# Patient Record
Sex: Female | Born: 1980 | Race: Black or African American | Hispanic: No | Marital: Married | Smoking: Never smoker
Health system: Southern US, Community
[De-identification: ages and names within clinical notes are randomized; demographics above are authoritative.]

## PROBLEM LIST (undated history)

## (undated) DIAGNOSIS — F411 Generalized anxiety disorder: Secondary | ICD-10-CM

## (undated) DIAGNOSIS — I10 Essential (primary) hypertension: Secondary | ICD-10-CM

## (undated) DIAGNOSIS — F410 Panic disorder [episodic paroxysmal anxiety] without agoraphobia: Secondary | ICD-10-CM

## (undated) DIAGNOSIS — D509 Iron deficiency anemia, unspecified: Secondary | ICD-10-CM

## (undated) DIAGNOSIS — Z1159 Encounter for screening for other viral diseases: Secondary | ICD-10-CM

## (undated) DIAGNOSIS — R110 Nausea: Secondary | ICD-10-CM

## (undated) DIAGNOSIS — D649 Anemia, unspecified: Secondary | ICD-10-CM

## (undated) DIAGNOSIS — R0609 Other forms of dyspnea: Secondary | ICD-10-CM

## (undated) DIAGNOSIS — Z114 Encounter for screening for human immunodeficiency virus [HIV]: Secondary | ICD-10-CM

## (undated) DIAGNOSIS — Z8611 Personal history of tuberculosis: Secondary | ICD-10-CM

## (undated) DIAGNOSIS — IMO0002 Reserved for concepts with insufficient information to code with codable children: Secondary | ICD-10-CM

## (undated) DIAGNOSIS — A159 Respiratory tuberculosis unspecified: Secondary | ICD-10-CM

## (undated) DIAGNOSIS — O24419 Gestational diabetes mellitus in pregnancy, unspecified control: Secondary | ICD-10-CM

## (undated) DIAGNOSIS — R87619 Unspecified abnormal cytological findings in specimens from cervix uteri: Secondary | ICD-10-CM

## (undated) HISTORY — PX: EYE SURGERY: SHX253

## (undated) HISTORY — PX: NECK LESION BIOPSY: SHX2078

## (undated) HISTORY — DX: Reserved for concepts with insufficient information to code with codable children: IMO0002

## (undated) HISTORY — DX: Unspecified abnormal cytological findings in specimens from cervix uteri: R87.619

## (undated) HISTORY — DX: Gestational diabetes mellitus in pregnancy, unspecified control: O24.419

## (undated) HISTORY — DX: Personal history of tuberculosis: Z86.11

---
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
## 1999-05-19 ENCOUNTER — Emergency Department (HOSPITAL_COMMUNITY): Admission: EM | Admit: 1999-05-19 | Discharge: 1999-05-19 | Payer: Self-pay | Admitting: Emergency Medicine

## 1999-05-21 ENCOUNTER — Emergency Department (HOSPITAL_COMMUNITY): Admission: EM | Admit: 1999-05-21 | Discharge: 1999-05-21 | Payer: Self-pay | Admitting: Emergency Medicine

## 1999-08-04 ENCOUNTER — Inpatient Hospital Stay (HOSPITAL_COMMUNITY): Admission: EM | Admit: 1999-08-04 | Discharge: 1999-08-04 | Payer: Self-pay | Admitting: *Deleted

## 2000-04-12 ENCOUNTER — Emergency Department (HOSPITAL_COMMUNITY): Admission: EM | Admit: 2000-04-12 | Discharge: 2000-04-12 | Payer: Self-pay | Admitting: Emergency Medicine

## 2000-09-21 ENCOUNTER — Emergency Department (HOSPITAL_COMMUNITY): Admission: EM | Admit: 2000-09-21 | Discharge: 2000-09-21 | Payer: Self-pay | Admitting: Emergency Medicine

## 2002-04-23 ENCOUNTER — Emergency Department (HOSPITAL_COMMUNITY): Admission: EM | Admit: 2002-04-23 | Discharge: 2002-04-23 | Payer: Self-pay | Admitting: Emergency Medicine

## 2004-04-11 ENCOUNTER — Emergency Department (HOSPITAL_COMMUNITY): Admission: EM | Admit: 2004-04-11 | Discharge: 2004-04-11 | Payer: Self-pay | Admitting: Family Medicine

## 2005-09-04 ENCOUNTER — Emergency Department (HOSPITAL_COMMUNITY): Admission: EM | Admit: 2005-09-04 | Discharge: 2005-09-04 | Payer: Self-pay | Admitting: Family Medicine

## 2008-07-08 ENCOUNTER — Emergency Department (HOSPITAL_COMMUNITY): Admission: EM | Admit: 2008-07-08 | Discharge: 2008-07-08 | Payer: Self-pay | Admitting: Emergency Medicine

## 2009-05-19 ENCOUNTER — Emergency Department (HOSPITAL_COMMUNITY): Admission: EM | Admit: 2009-05-19 | Discharge: 2009-05-19 | Payer: Self-pay | Admitting: Emergency Medicine

## 2010-08-02 LAB — WET PREP, GENITAL
Clue Cells Wet Prep HPF POC: NONE SEEN
Trich, Wet Prep: NONE SEEN

## 2010-08-02 LAB — URINALYSIS, ROUTINE W REFLEX MICROSCOPIC
Bilirubin Urine: NEGATIVE
Glucose, UA: NEGATIVE mg/dL
Hgb urine dipstick: NEGATIVE
Ketones, ur: 15 mg/dL — AB
Nitrite: NEGATIVE
Protein, ur: NEGATIVE mg/dL
Specific Gravity, Urine: 1.03 (ref 1.005–1.030)
Urobilinogen, UA: 1 mg/dL (ref 0.0–1.0)
pH: 6 (ref 5.0–8.0)

## 2010-08-02 LAB — POCT PREGNANCY, URINE: Preg Test, Ur: NEGATIVE

## 2010-12-10 LAB — ABO/RH: RH Type: POSITIVE

## 2010-12-10 LAB — STREP B DNA PROBE: GBS: POSITIVE

## 2010-12-10 LAB — GC/CHLAMYDIA PROBE AMP, GENITAL: Chlamydia: NEGATIVE

## 2010-12-10 LAB — RUBELLA ANTIBODY, IGM: Rubella: IMMUNE

## 2010-12-19 ENCOUNTER — Emergency Department: Payer: Self-pay | Admitting: Otolaryngology

## 2011-04-30 ENCOUNTER — Ambulatory Visit: Payer: Self-pay

## 2011-06-24 ENCOUNTER — Encounter: Payer: Self-pay | Admitting: Obstetrics and Gynecology

## 2011-06-24 LAB — URINALYSIS, COMPLETE
Bacteria: NONE SEEN
Bilirubin,UR: NEGATIVE
Blood: NEGATIVE
Glucose,UR: NEGATIVE mg/dL (ref 0–75)
Leukocyte Esterase: NEGATIVE
RBC,UR: 3 /HPF (ref 0–5)
Specific Gravity: 1.026 (ref 1.003–1.030)
Squamous Epithelial: 16
WBC UR: 3 /HPF (ref 0–5)

## 2011-07-08 ENCOUNTER — Other Ambulatory Visit: Payer: Self-pay | Admitting: Obstetrics

## 2011-07-08 ENCOUNTER — Encounter: Payer: Self-pay | Admitting: Obstetrics and Gynecology

## 2011-07-08 LAB — URINALYSIS, COMPLETE
Bilirubin,UR: NEGATIVE
Glucose,UR: NEGATIVE mg/dL (ref 0–75)
Nitrite: NEGATIVE
Ph: 6 (ref 4.5–8.0)
Specific Gravity: 1.028 (ref 1.003–1.030)
WBC UR: 7 /HPF (ref 0–5)

## 2011-07-09 ENCOUNTER — Telehealth (HOSPITAL_COMMUNITY): Payer: Self-pay | Admitting: *Deleted

## 2011-07-09 ENCOUNTER — Encounter (HOSPITAL_COMMUNITY): Payer: Self-pay | Admitting: *Deleted

## 2011-07-09 NOTE — Telephone Encounter (Signed)
Preadmission screen  

## 2011-07-13 ENCOUNTER — Inpatient Hospital Stay (HOSPITAL_COMMUNITY)
Admission: RE | Admit: 2011-07-13 | Discharge: 2011-07-17 | DRG: 766 | Disposition: A | Discharge status: Home / Self Care | Source: Ambulatory Visit | Attending: Obstetrics | Admitting: Obstetrics

## 2011-07-13 ENCOUNTER — Encounter (HOSPITAL_COMMUNITY): Payer: Self-pay | Admitting: Anesthesiology

## 2011-07-13 ENCOUNTER — Encounter (HOSPITAL_COMMUNITY): Payer: Self-pay

## 2011-07-13 DIAGNOSIS — E119 Type 2 diabetes mellitus without complications: Secondary | ICD-10-CM | POA: Diagnosis present

## 2011-07-13 DIAGNOSIS — O9903 Anemia complicating the puerperium: Secondary | ICD-10-CM | POA: Diagnosis not present

## 2011-07-13 DIAGNOSIS — O324XX0 Maternal care for high head at term, not applicable or unspecified: Secondary | ICD-10-CM | POA: Diagnosis present

## 2011-07-13 DIAGNOSIS — O99814 Abnormal glucose complicating childbirth: Principal | ICD-10-CM | POA: Diagnosis present

## 2011-07-13 DIAGNOSIS — D649 Anemia, unspecified: Secondary | ICD-10-CM | POA: Diagnosis not present

## 2011-07-13 DIAGNOSIS — D62 Acute posthemorrhagic anemia: Secondary | ICD-10-CM | POA: Diagnosis not present

## 2011-07-13 HISTORY — DX: Respiratory tuberculosis unspecified: A15.9

## 2011-07-13 LAB — RPR: RPR Ser Ql: NONREACTIVE

## 2011-07-13 LAB — CBC
MCH: 26.1 pg (ref 26.0–34.0)
Platelets: 348 10*3/uL (ref 150–400)
RBC: 3.95 MIL/uL (ref 3.87–5.11)

## 2011-07-13 MED ORDER — OXYTOCIN 20 UNITS IN LACTATED RINGERS INFUSION - SIMPLE
1.0000 m[IU]/min | INTRAVENOUS | Status: DC
  Administered 2011-07-13: 1 m[IU]/min via INTRAVENOUS
  Filled 2011-07-13: qty 1000

## 2011-07-13 MED ORDER — TERBUTALINE SULFATE 1 MG/ML IJ SOLN: 0.2500 mg | Freq: Once | INTRAMUSCULAR | Status: AC | PRN

## 2011-07-13 MED ORDER — EPHEDRINE 5 MG/ML INJ: 10.0000 mg | INTRAVENOUS | Status: DC | PRN

## 2011-07-13 MED ORDER — LIDOCAINE HCL (PF) 1 % IJ SOLN
30.0000 mL | INTRAMUSCULAR | Status: DC | PRN
  Filled 2011-07-13 (×2): qty 30

## 2011-07-13 MED ORDER — FLEET ENEMA 7-19 GM/118ML RE ENEM: 1.0000 | ENEMA | RECTAL | Status: DC | PRN

## 2011-07-13 MED ORDER — IBUPROFEN 600 MG PO TABS: 600.0000 mg | ORAL_TABLET | Freq: Four times a day (QID) | ORAL | Status: DC | PRN

## 2011-07-13 MED ORDER — ONDANSETRON HCL 4 MG/2ML IJ SOLN: 4.0000 mg | Freq: Four times a day (QID) | INTRAMUSCULAR | Status: DC | PRN

## 2011-07-13 MED ORDER — LACTATED RINGERS IV SOLN: 500.0000 mL | INTRAVENOUS | Status: DC | PRN

## 2011-07-13 MED ORDER — PHENYLEPHRINE 40 MCG/ML (10ML) SYRINGE FOR IV PUSH (FOR BLOOD PRESSURE SUPPORT): 80.0000 ug | PREFILLED_SYRINGE | INTRAVENOUS | Status: DC | PRN

## 2011-07-13 MED ORDER — OXYTOCIN BOLUS FROM INFUSION
500.0000 mL | Freq: Once | INTRAVENOUS | Status: DC
  Filled 2011-07-13: qty 500

## 2011-07-13 MED ORDER — BUTORPHANOL TARTRATE 2 MG/ML IJ SOLN: 1.0000 mg | INTRAMUSCULAR | Status: DC | PRN

## 2011-07-13 MED ORDER — ZOLPIDEM TARTRATE 10 MG PO TABS: 10.0000 mg | ORAL_TABLET | Freq: Every evening | ORAL | Status: DC | PRN

## 2011-07-13 MED ORDER — LACTATED RINGERS IV SOLN
INTRAVENOUS | Status: DC
  Administered 2011-07-13 (×2): via INTRAVENOUS
  Administered 2011-07-14: 300 mL via INTRAVENOUS
  Administered 2011-07-14 (×2): via INTRAVENOUS

## 2011-07-13 MED ORDER — FENTANYL 2.5 MCG/ML BUPIVACAINE 1/10 % EPIDURAL INFUSION (WH - ANES)
INTRAMUSCULAR | Status: DC | PRN
  Administered 2011-07-13: 14 mL/h via EPIDURAL

## 2011-07-13 MED ORDER — HYDROXYZINE HCL 50 MG PO TABS
50.0000 mg | ORAL_TABLET | Freq: Four times a day (QID) | ORAL | Status: DC | PRN
  Filled 2011-07-13: qty 1

## 2011-07-13 MED ORDER — OXYCODONE-ACETAMINOPHEN 5-325 MG PO TABS: 1.0000 | ORAL_TABLET | ORAL | Status: DC | PRN

## 2011-07-13 MED ORDER — DIPHENHYDRAMINE HCL 50 MG/ML IJ SOLN: 12.5000 mg | INTRAMUSCULAR | Status: DC | PRN

## 2011-07-13 MED ORDER — LIDOCAINE HCL 1.5 % IJ SOLN
INTRAMUSCULAR | Status: DC | PRN
  Administered 2011-07-13 (×3): 5 mL via EPIDURAL

## 2011-07-13 MED ORDER — EPHEDRINE 5 MG/ML INJ
10.0000 mg | INTRAVENOUS | Status: DC | PRN
  Filled 2011-07-13: qty 4

## 2011-07-13 MED ORDER — HYDROXYZINE HCL 50 MG/ML IM SOLN
50.0000 mg | Freq: Four times a day (QID) | INTRAMUSCULAR | Status: DC | PRN
  Filled 2011-07-13: qty 1

## 2011-07-13 MED ORDER — FENTANYL 2.5 MCG/ML BUPIVACAINE 1/10 % EPIDURAL INFUSION (WH - ANES)
14.0000 mL/h | INTRAMUSCULAR | Status: DC
  Administered 2011-07-14 (×4): 14 mL/h via EPIDURAL
  Filled 2011-07-13 (×6): qty 60

## 2011-07-13 MED ORDER — PENICILLIN G POTASSIUM 5000000 UNITS IJ SOLR
2.5000 10*6.[IU] | INTRAVENOUS | Status: DC
  Administered 2011-07-13 – 2011-07-14 (×4): 2.5 10*6.[IU] via INTRAVENOUS
  Filled 2011-07-13 (×8): qty 2.5

## 2011-07-13 MED ORDER — LACTATED RINGERS IV SOLN: 500.0000 mL | Freq: Once | INTRAVENOUS | Status: DC

## 2011-07-13 MED ORDER — OXYTOCIN 20 UNITS IN LACTATED RINGERS INFUSION - SIMPLE: 125.0000 mL/h | Freq: Once | INTRAVENOUS | Status: DC

## 2011-07-13 MED ORDER — PHENYLEPHRINE 40 MCG/ML (10ML) SYRINGE FOR IV PUSH (FOR BLOOD PRESSURE SUPPORT)
80.0000 ug | PREFILLED_SYRINGE | INTRAVENOUS | Status: DC | PRN
  Filled 2011-07-13: qty 5

## 2011-07-13 MED ORDER — CITRIC ACID-SODIUM CITRATE 334-500 MG/5ML PO SOLN
30.0000 mL | ORAL | Status: DC | PRN
  Administered 2011-07-14: 30 mL via ORAL
  Filled 2011-07-13: qty 15

## 2011-07-13 MED ORDER — ACETAMINOPHEN 325 MG PO TABS: 650.0000 mg | ORAL_TABLET | ORAL | Status: DC | PRN

## 2011-07-13 MED ORDER — PENICILLIN G POTASSIUM 5000000 UNITS IJ SOLR
5.0000 10*6.[IU] | Freq: Once | INTRAVENOUS | Status: AC
  Administered 2011-07-13: 5 10*6.[IU] via INTRAVENOUS
  Filled 2011-07-13: qty 5

## 2011-07-13 MED ORDER — MISOPROSTOL 25 MCG QUARTER TABLET
25.0000 ug | ORAL_TABLET | ORAL | Status: DC | PRN
  Administered 2011-07-13 (×2): 25 ug via VAGINAL
  Filled 2011-07-13 (×2): qty 0.25

## 2011-07-13 NOTE — Progress Notes (Signed)
Ashley Nielsen is a 31 y.o. G1P0000 at [redacted]w[redacted]d by LMP admitted for induction of labor due to Gestational diabetes and poor compliance and poor control.  Subjective:   Objective: BP 142/89  Pulse 88  Temp(Src) 98.5 F (36.9 C) (Oral)  Resp 20  Ht 5\' 10"  (1.778 m)  Wt 296 lb (134.265 kg)  BMI 42.47 kg/m2  LMP 10/13/2010      FHT:  FHR: 150 bpm, variability: moderate,  accelerations:  Present,  decelerations:  Absent UC:   none SVE:   Dilation: Fingertip Effacement (%): 50 Station: -3 Exam by:: Erline Hau RNC  Labs: Lab Results  Component Value Date   WBC 10.2 07/13/2011   HGB 10.3* 07/13/2011   HCT 31.4* 07/13/2011   MCV 79.5 07/13/2011   PLT 348 07/13/2011    Assessment / Plan: 39 weeks.  Gestational diabetes.  Poor compliance and poor control.  2-stage IOL.  Labor: Cytotec cervical ripening started. Preeclampsia:  n/a Fetal Wellbeing:  Category I Pain Control:  not needed I/D:  n/a Anticipated MOD:  NSVD  Ayinde Swim A 07/13/2011, 12:30 PM

## 2011-07-13 NOTE — H&P (Signed)
Ashley Nielsen is a 31 y.o. female presenting for IOL for poor compliance and poorly controlled gestational diabetes. Maternal Medical History:  Fetal activity: Perceived fetal activity is normal.   Last perceived fetal movement was within the past hour.    Prenatal complications: no prenatal complications Prenatal Complications - Diabetes: gestational.    OB History    Grav Para Term Preterm Abortions TAB SAB Ect Mult Living   1              Past Medical History  Diagnosis Date  . Abnormal Pap smear   . History of tuberculosis 2000    treated  . Gestational diabetes     diet controlled  . Tuberculosis    Past Surgical History  Procedure Date  . Neck lesion biopsy     had tb in lymph node  . Eye surgery     abcess behind L eye   Family History: family history includes Alcohol abuse in her maternal grandmother; Alzheimer's disease in her maternal grandmother; Depression in her mother; Diabetes in her mother; Heart disease in her maternal grandmother; Hyperlipidemia in her mother; Hypertension in her mother; Learning disabilities in her other; and Stroke in her maternal grandmother. Social History:  reports that she has never smoked. She has never used smokeless tobacco. She reports that she does not drink alcohol or use illicit drugs.  Review of Systems  All other systems reviewed and are negative.      Height 5\' 10"  (1.778 m), weight 296 lb (134.265 kg), last menstrual period 10/13/2010. Maternal Exam:  Abdomen: Patient reports no abdominal tenderness. Fetal presentation: vertex  Introitus: Normal vulva. Normal vagina.    Physical Exam  Nursing note and vitals reviewed. Constitutional: She appears well-developed and well-nourished.  Cardiovascular: Normal rate and regular rhythm.   Respiratory: Effort normal and breath sounds normal.  GI: Soft.  Genitourinary: Vagina normal and uterus normal.  Musculoskeletal: Normal range of motion.  Skin: Skin is warm and dry.    Prenatal labs: ABO, Rh: AB/Positive/-- (07/26 0000) Antibody: Negative (07/26 0000) Rubella: Immune (07/26 0000) RPR: Nonreactive (07/24 0810)  HBsAg: Negative (12/10 0000)  HIV: Non-reactive (07/26 0000)  GBS: Positive (07/26 0000)   Assessment/Plan: 39 weeks.  Gestational diabetes.  Poor compliance and poor control.  2 stage IOL.   Fayola Meckes A 07/13/2011, 9:18 AM

## 2011-07-13 NOTE — Anesthesia Procedure Notes (Signed)
Epidural Patient location during procedure: OB Start time: 07/13/2011 8:39 PM End time: 07/13/2011 8:43 PM Reason for block: procedure for pain  Staffing Anesthesiologist: Sandrea Hughs Performed by: anesthesiologist   Preanesthetic Checklist Completed: patient identified, site marked, surgical consent, pre-op evaluation, timeout performed, IV checked, risks and benefits discussed and monitors and equipment checked  Epidural Patient position: sitting Prep: site prepped and draped and DuraPrep Patient monitoring: continuous pulse ox and blood pressure Approach: midline Injection technique: LOR air  Needle:  Needle type: Tuohy  Needle gauge: 17 G Needle length: 9 cm Needle insertion depth: 9 cm Catheter type: closed end flexible Catheter size: 19 Gauge Catheter at skin depth: 14 cm Test dose: negative and 1.5% lidocaine  Assessment Sensory level: T10 Events: blood not aspirated, injection not painful, no injection resistance, positive IV test and no paresthesia  Additional Notes So catheter d/c tip intact and procedure redone @L2 -L3 s comp. - asp - test dose

## 2011-07-13 NOTE — Anesthesia Preprocedure Evaluation (Signed)
Anesthesia Evaluation  Patient identified by MRN, date of birth, ID band Patient awake    Reviewed: Allergy & Precautions, H&P , NPO status , Patient's Chart, lab work & pertinent test results  Airway Mallampati: II TM Distance: >3 FB Neck ROM: full    Dental No notable dental hx.    Pulmonary neg pulmonary ROS,    Pulmonary exam normal       Cardiovascular neg cardio ROS     Neuro/Psych Negative Neurological ROS  Negative Psych ROS   GI/Hepatic negative GI ROS, Neg liver ROS,   Endo/Other  Diabetes mellitus-, GestationalMorbid obesity  Renal/GU negative Renal ROS  Genitourinary negative   Musculoskeletal   Abdominal (+) obese,   Peds negative pediatric ROS (+)  Hematology negative hematology ROS (+)   Anesthesia Other Findings   Reproductive/Obstetrics (+) Pregnancy                           Anesthesia Physical Anesthesia Plan  ASA: III  Anesthesia Plan: Epidural   Post-op Pain Management:    Induction:   Airway Management Planned:   Additional Equipment:   Intra-op Plan:   Post-operative Plan:   Informed Consent: I have reviewed the patients History and Physical, chart, labs and discussed the procedure including the risks, benefits and alternatives for the proposed anesthesia with the patient or authorized representative who has indicated his/her understanding and acceptance.     Plan Discussed with:   Anesthesia Plan Comments:         Anesthesia Quick Evaluation

## 2011-07-13 NOTE — Progress Notes (Signed)
Alletta Mattos is a 31 y.o. G1P0000 at [redacted]w[redacted]d by LMP admitted for induction of labor due to GDM, poor compliance and control. Due date 07-20-11.  Subjective:   Objective: BP 149/69  Pulse 81  Temp(Src) 98.5 F (36.9 C) (Oral)  Resp 18  Ht 5\' 10"  (1.778 m)  Wt 296 lb (134.265 kg)  BMI 42.47 kg/m2  LMP 10/13/2010      FHT:  FHR: 150 bpm, variability: moderate,  accelerations:  Present,  decelerations:  Absent UC:   regular, every 4-7 minutes SVE:   Dilation: 1 Effacement (%): 50 Station: -3 Exam by:: Erline Hau RNC  Labs: Lab Results  Component Value Date   WBC 10.2 07/13/2011   HGB 10.3* 07/13/2011   HCT 31.4* 07/13/2011   MCV 79.5 07/13/2011   PLT 348 07/13/2011    Assessment / Plan: 39 weeks.  IOL.  SROM.  Pitocin started.  Labor: Latent phase. Preeclampsia:  n/a Fetal Wellbeing:  Category I Pain Control:  Labor support without medications I/D:  n/a Anticipated MOD:  NSVD  Rosaria Kubin A 07/13/2011, 5:52 PM

## 2011-07-13 NOTE — Progress Notes (Signed)
Ashley Nielsen is Nielsen 31 y.o. G1P0000 at [redacted]w[redacted]d by LMP admitted for induction of labor due to Gestational diabetes.  Subjective:   Objective: BP 92/60  Pulse 90  Temp(Src) 98.8 F (37.1 C) (Oral)  Resp 18  Ht 5\' 10"  (1.778 m)  Wt 296 lb (134.265 kg)  BMI 42.47 kg/m2  SpO2 100%  LMP 10/13/2010      FHT:  FHR: 150 bpm, variability: moderate,  accelerations:  Present,  decelerations:  Absent UC:   regular, every 3-5 minutes SVE:   Dilation: 2 Effacement (%): 50 Station: -2 Exam by:: Nielsen.Davis,RN  Labs: Lab Results  Component Value Date   WBC 10.2 07/13/2011   HGB 10.3* 07/13/2011   HCT 31.4* 07/13/2011   MCV 79.5 07/13/2011   PLT 348 07/13/2011    Assessment / Plan: 39 weeks.  GDM.  IOL.  Latent phase.  SROM.  Continue pitocin.  Labor: Latent phase Preeclampsia:  n/Nielsen Fetal Wellbeing:  Category I Pain Control:  Labor support without medications I/D:  n/Nielsen Anticipated MOD:  NSVD  Ashley Nielsen 07/13/2011, 10:50 PM

## 2011-07-14 ENCOUNTER — Encounter (HOSPITAL_COMMUNITY): Payer: Self-pay

## 2011-07-14 ENCOUNTER — Encounter (HOSPITAL_COMMUNITY)
Admission: RE | Disposition: A | Discharge status: Home / Self Care | Payer: Self-pay | Source: Ambulatory Visit | Attending: Obstetrics

## 2011-07-14 ENCOUNTER — Encounter (HOSPITAL_COMMUNITY): Payer: Self-pay | Admitting: Anesthesiology

## 2011-07-14 ENCOUNTER — Inpatient Hospital Stay (HOSPITAL_COMMUNITY): Admission: RE | Admit: 2011-07-14 | Payer: Self-pay | Source: Ambulatory Visit

## 2011-07-14 ENCOUNTER — Inpatient Hospital Stay (HOSPITAL_COMMUNITY): Admitting: Anesthesiology

## 2011-07-14 DIAGNOSIS — E119 Type 2 diabetes mellitus without complications: Secondary | ICD-10-CM | POA: Diagnosis present

## 2011-07-14 LAB — HEMOGLOBIN AND HEMATOCRIT, BLOOD
HCT: 29.1 % — ABNORMAL LOW (ref 36.0–46.0)
Hemoglobin: 9.3 g/dL — ABNORMAL LOW (ref 12.0–15.0)

## 2011-07-14 LAB — CBC
HCT: 29.3 % — ABNORMAL LOW (ref 36.0–46.0)
Hemoglobin: 9.4 g/dL — ABNORMAL LOW (ref 12.0–15.0)
MCH: 25.8 pg — ABNORMAL LOW (ref 26.0–34.0)
MCHC: 32.1 g/dL (ref 30.0–36.0)
MCV: 80.5 fL (ref 78.0–100.0)
RDW: 16.4 % — ABNORMAL HIGH (ref 11.5–15.5)

## 2011-07-14 SURGERY — Surgical Case
Anesthesia: Regional | Site: Abdomen | Wound class: Clean Contaminated

## 2011-07-14 MED ORDER — SIMETHICONE 80 MG PO CHEW
80.0000 mg | CHEWABLE_TABLET | ORAL | Status: DC | PRN
  Administered 2011-07-14 – 2011-07-17 (×9): 80 mg via ORAL

## 2011-07-14 MED ORDER — ONDANSETRON HCL 4 MG/2ML IJ SOLN
INTRAMUSCULAR | Status: DC | PRN
  Administered 2011-07-14: 4 mg via INTRAVENOUS

## 2011-07-14 MED ORDER — PRENATAL MULTIVITAMIN CH
1.0000 | ORAL_TABLET | Freq: Every day | ORAL | Status: DC
  Administered 2011-07-15 – 2011-07-17 (×3): 1 via ORAL
  Filled 2011-07-14 (×3): qty 1

## 2011-07-14 MED ORDER — CEFAZOLIN SODIUM-DEXTROSE 2-3 GM-% IV SOLR
2.0000 g | INTRAVENOUS | Status: DC
  Filled 2011-07-14: qty 50

## 2011-07-14 MED ORDER — WITCH HAZEL-GLYCERIN EX PADS: 1.0000 "application " | MEDICATED_PAD | CUTANEOUS | Status: DC | PRN

## 2011-07-14 MED ORDER — FENTANYL CITRATE 0.05 MG/ML IJ SOLN
INTRAMUSCULAR | Status: AC
  Filled 2011-07-14: qty 2

## 2011-07-14 MED ORDER — MEASLES, MUMPS & RUBELLA VAC ~~LOC~~ INJ: 0.5000 mL | INJECTION | Freq: Once | SUBCUTANEOUS | Status: DC

## 2011-07-14 MED ORDER — PHENYLEPHRINE 40 MCG/ML (10ML) SYRINGE FOR IV PUSH (FOR BLOOD PRESSURE SUPPORT)
PREFILLED_SYRINGE | INTRAVENOUS | Status: AC
  Filled 2011-07-14: qty 5

## 2011-07-14 MED ORDER — DIPHENHYDRAMINE HCL 25 MG PO CAPS
25.0000 mg | ORAL_CAPSULE | ORAL | Status: DC | PRN
  Administered 2011-07-15 – 2011-07-16 (×4): 25 mg via ORAL
  Filled 2011-07-14 (×3): qty 1

## 2011-07-14 MED ORDER — MEPERIDINE HCL 25 MG/ML IJ SOLN: 6.2500 mg | INTRAMUSCULAR | Status: DC | PRN

## 2011-07-14 MED ORDER — LACTATED RINGERS IV SOLN
INTRAVENOUS | Status: DC
  Administered 2011-07-14 – 2011-07-15 (×2): via INTRAVENOUS

## 2011-07-14 MED ORDER — BUPIVACAINE HCL (PF) 0.25 % IJ SOLN
INTRAMUSCULAR | Status: DC | PRN
  Administered 2011-07-14: 5 mL via EPIDURAL

## 2011-07-14 MED ORDER — OXYTOCIN 10 UNIT/ML IJ SOLN
INTRAMUSCULAR | Status: DC | PRN
  Administered 2011-07-14: 40 [IU] via INTRAMUSCULAR
  Administered 2011-07-14: 20 [IU] via INTRAMUSCULAR

## 2011-07-14 MED ORDER — PHENYLEPHRINE 40 MCG/ML (10ML) SYRINGE FOR IV PUSH (FOR BLOOD PRESSURE SUPPORT)
PREFILLED_SYRINGE | INTRAVENOUS | Status: AC
  Filled 2011-07-14: qty 20

## 2011-07-14 MED ORDER — ONDANSETRON HCL 4 MG/2ML IJ SOLN: 4.0000 mg | Freq: Three times a day (TID) | INTRAMUSCULAR | Status: DC | PRN

## 2011-07-14 MED ORDER — SODIUM CHLORIDE 0.9 % IV SOLN: 1.0000 ug/kg/h | INTRAVENOUS | Status: DC | PRN

## 2011-07-14 MED ORDER — OXYTOCIN 10 UNIT/ML IJ SOLN
INTRAMUSCULAR | Status: AC
  Filled 2011-07-14: qty 4

## 2011-07-14 MED ORDER — MAGNESIUM HYDROXIDE 400 MG/5ML PO SUSP: 30.0000 mL | ORAL | Status: DC | PRN

## 2011-07-14 MED ORDER — NALBUPHINE HCL 10 MG/ML IJ SOLN: 5.0000 mg | INTRAMUSCULAR | Status: DC | PRN

## 2011-07-14 MED ORDER — EPHEDRINE 5 MG/ML INJ
INTRAVENOUS | Status: AC
  Filled 2011-07-14: qty 10

## 2011-07-14 MED ORDER — FENTANYL CITRATE 0.05 MG/ML IJ SOLN: 25.0000 ug | INTRAMUSCULAR | Status: DC | PRN

## 2011-07-14 MED ORDER — IBUPROFEN 600 MG PO TABS
600.0000 mg | ORAL_TABLET | Freq: Four times a day (QID) | ORAL | Status: DC
  Administered 2011-07-15 – 2011-07-17 (×10): 600 mg via ORAL
  Filled 2011-07-14 (×8): qty 1

## 2011-07-14 MED ORDER — NALOXONE HCL 0.4 MG/ML IJ SOLN: 0.4000 mg | INTRAMUSCULAR | Status: DC | PRN

## 2011-07-14 MED ORDER — MORPHINE SULFATE 0.5 MG/ML IJ SOLN
INTRAMUSCULAR | Status: AC
  Filled 2011-07-14: qty 10

## 2011-07-14 MED ORDER — ONDANSETRON HCL 4 MG PO TABS: 4.0000 mg | ORAL_TABLET | ORAL | Status: DC | PRN

## 2011-07-14 MED ORDER — LACTATED RINGERS IV SOLN
INTRAVENOUS | Status: DC | PRN
  Administered 2011-07-14: 15:00:00 via INTRAVENOUS

## 2011-07-14 MED ORDER — DIBUCAINE 1 % RE OINT: 1.0000 "application " | TOPICAL_OINTMENT | RECTAL | Status: DC | PRN

## 2011-07-14 MED ORDER — KETOROLAC TROMETHAMINE 60 MG/2ML IM SOLN
60.0000 mg | Freq: Once | INTRAMUSCULAR | Status: AC | PRN
  Administered 2011-07-14: 60 mg via INTRAMUSCULAR

## 2011-07-14 MED ORDER — KETOROLAC TROMETHAMINE 30 MG/ML IJ SOLN: 30.0000 mg | Freq: Four times a day (QID) | INTRAMUSCULAR | Status: AC | PRN

## 2011-07-14 MED ORDER — ONDANSETRON HCL 4 MG/2ML IJ SOLN: 4.0000 mg | INTRAMUSCULAR | Status: DC | PRN

## 2011-07-14 MED ORDER — METOCLOPRAMIDE HCL 5 MG/ML IJ SOLN: 10.0000 mg | Freq: Three times a day (TID) | INTRAMUSCULAR | Status: DC | PRN

## 2011-07-14 MED ORDER — DIPHENHYDRAMINE HCL 50 MG/ML IJ SOLN: 12.5000 mg | INTRAMUSCULAR | Status: DC | PRN

## 2011-07-14 MED ORDER — SODIUM CHLORIDE 0.9 % IJ SOLN: 3.0000 mL | INTRAMUSCULAR | Status: DC | PRN

## 2011-07-14 MED ORDER — OXYCODONE-ACETAMINOPHEN 5-325 MG PO TABS
1.0000 | ORAL_TABLET | ORAL | Status: DC | PRN
  Administered 2011-07-15 (×2): 1 via ORAL
  Administered 2011-07-16 (×2): 2 via ORAL
  Administered 2011-07-16: 1 via ORAL
  Administered 2011-07-16 – 2011-07-17 (×3): 2 via ORAL
  Filled 2011-07-14 (×2): qty 1
  Filled 2011-07-14 (×5): qty 2
  Filled 2011-07-14: qty 1

## 2011-07-14 MED ORDER — CEFAZOLIN SODIUM 1-5 GM-% IV SOLN
INTRAVENOUS | Status: DC | PRN
  Administered 2011-07-14: 2 g via INTRAVENOUS

## 2011-07-14 MED ORDER — OXYTOCIN 20 UNITS IN LACTATED RINGERS INFUSION - SIMPLE: 125.0000 mL/h | INTRAVENOUS | Status: AC

## 2011-07-14 MED ORDER — SENNOSIDES-DOCUSATE SODIUM 8.6-50 MG PO TABS
2.0000 | ORAL_TABLET | Freq: Every day | ORAL | Status: DC
  Administered 2011-07-14 – 2011-07-16 (×3): 2 via ORAL

## 2011-07-14 MED ORDER — SODIUM BICARBONATE 8.4 % IV SOLN
INTRAVENOUS | Status: DC | PRN
  Administered 2011-07-14: 5 mL via EPIDURAL

## 2011-07-14 MED ORDER — FENTANYL CITRATE 0.05 MG/ML IJ SOLN
100.0000 ug | Freq: Once | INTRAMUSCULAR | Status: AC
  Administered 2011-07-14: 100 ug via EPIDURAL

## 2011-07-14 MED ORDER — EPHEDRINE SULFATE 50 MG/ML IJ SOLN
INTRAMUSCULAR | Status: DC | PRN
  Administered 2011-07-14: 20 mg via INTRAVENOUS
  Administered 2011-07-14: 10 mg via INTRAVENOUS

## 2011-07-14 MED ORDER — SCOPOLAMINE 1 MG/3DAYS TD PT72: 1.0000 | MEDICATED_PATCH | Freq: Once | TRANSDERMAL | Status: DC

## 2011-07-14 MED ORDER — LANOLIN HYDROUS EX OINT: 1.0000 "application " | TOPICAL_OINTMENT | CUTANEOUS | Status: DC | PRN

## 2011-07-14 MED ORDER — FERROUS SULFATE 325 (65 FE) MG PO TABS
325.0000 mg | ORAL_TABLET | Freq: Two times a day (BID) | ORAL | Status: DC
  Administered 2011-07-15 – 2011-07-17 (×5): 325 mg via ORAL
  Filled 2011-07-14 (×5): qty 1

## 2011-07-14 MED ORDER — PHENYLEPHRINE HCL 10 MG/ML IJ SOLN
INTRAMUSCULAR | Status: DC | PRN
  Administered 2011-07-14 (×3): 160 ug via INTRAVENOUS
  Administered 2011-07-14 (×3): 40 ug via INTRAVENOUS

## 2011-07-14 MED ORDER — DIPHENHYDRAMINE HCL 25 MG PO CAPS
25.0000 mg | ORAL_CAPSULE | Freq: Four times a day (QID) | ORAL | Status: DC | PRN
  Filled 2011-07-14: qty 1

## 2011-07-14 MED ORDER — MEDROXYPROGESTERONE ACETATE 150 MG/ML IM SUSP: 150.0000 mg | INTRAMUSCULAR | Status: DC | PRN

## 2011-07-14 MED ORDER — MORPHINE SULFATE (PF) 0.5 MG/ML IJ SOLN
INTRAMUSCULAR | Status: DC | PRN
  Administered 2011-07-14: 4 mg via EPIDURAL

## 2011-07-14 MED ORDER — ZOLPIDEM TARTRATE 5 MG PO TABS: 5.0000 mg | ORAL_TABLET | Freq: Every evening | ORAL | Status: DC | PRN

## 2011-07-14 MED ORDER — KETOROLAC TROMETHAMINE 60 MG/2ML IM SOLN
INTRAMUSCULAR | Status: AC
  Filled 2011-07-14: qty 2

## 2011-07-14 MED ORDER — IBUPROFEN 600 MG PO TABS
600.0000 mg | ORAL_TABLET | Freq: Four times a day (QID) | ORAL | Status: DC | PRN
  Filled 2011-07-14 (×2): qty 1

## 2011-07-14 MED ORDER — DIPHENHYDRAMINE HCL 50 MG/ML IJ SOLN
25.0000 mg | INTRAMUSCULAR | Status: DC | PRN
  Administered 2011-07-14: 25 mg via INTRAMUSCULAR
  Filled 2011-07-14: qty 1

## 2011-07-14 MED ORDER — TETANUS-DIPHTH-ACELL PERTUSSIS 5-2.5-18.5 LF-MCG/0.5 IM SUSP
0.5000 mL | Freq: Once | INTRAMUSCULAR | Status: AC
  Administered 2011-07-16: 0.5 mL via INTRAMUSCULAR
  Filled 2011-07-14: qty 0.5

## 2011-07-14 MED ORDER — ONDANSETRON HCL 4 MG/2ML IJ SOLN
INTRAMUSCULAR | Status: AC
  Filled 2011-07-14: qty 2

## 2011-07-14 SURGICAL SUPPLY — 45 items
APL SKNCLS STERI-STRIP NONHPOA (GAUZE/BANDAGES/DRESSINGS) ×1
BENZOIN TINCTURE PRP APPL 2/3 (GAUZE/BANDAGES/DRESSINGS) ×2 IMPLANT
CANISTER WOUND CARE 500ML ATS (WOUND CARE) IMPLANT
CHLORAPREP W/TINT 26ML (MISCELLANEOUS) ×2 IMPLANT
CLOTH BEACON ORANGE TIMEOUT ST (SAFETY) ×2 IMPLANT
CONTAINER PREFILL 10% NBF 15ML (MISCELLANEOUS) IMPLANT
DRESSING TELFA 8X3 (GAUZE/BANDAGES/DRESSINGS) ×2 IMPLANT
DRSG PAD ABDOMINAL 8X10 ST (GAUZE/BANDAGES/DRESSINGS) ×1 IMPLANT
DRSG VAC ATS LRG SENSATRAC (GAUZE/BANDAGES/DRESSINGS) IMPLANT
DRSG VAC ATS MED SENSATRAC (GAUZE/BANDAGES/DRESSINGS) IMPLANT
DRSG VAC ATS SM SENSATRAC (GAUZE/BANDAGES/DRESSINGS) IMPLANT
ELECT REM PT RETURN 9FT ADLT (ELECTROSURGICAL) ×2
ELECTRODE REM PT RTRN 9FT ADLT (ELECTROSURGICAL) ×1 IMPLANT
EXTRACTOR VACUUM M CUP 4 TUBE (SUCTIONS) IMPLANT
GAUZE SPONGE 4X4 12PLY STRL LF (GAUZE/BANDAGES/DRESSINGS) ×4 IMPLANT
GLOVE BIO SURGEON STRL SZ 6.5 (GLOVE) ×4 IMPLANT
GOWN PREVENTION PLUS LG XLONG (DISPOSABLE) ×6 IMPLANT
KIT ABG SYR 3ML LUER SLIP (SYRINGE) IMPLANT
NDL HYPO 25X5/8 SAFETYGLIDE (NEEDLE) ×1 IMPLANT
NEEDLE HYPO 25X5/8 SAFETYGLIDE (NEEDLE) ×2 IMPLANT
NS IRRIG 1000ML POUR BTL (IV SOLUTION) ×2 IMPLANT
PACK C SECTION WH (CUSTOM PROCEDURE TRAY) ×2 IMPLANT
PAD ABD 7.5X8 STRL (GAUZE/BANDAGES/DRESSINGS) ×2 IMPLANT
RTRCTR C-SECT PINK 25CM LRG (MISCELLANEOUS) IMPLANT
RTRCTR C-SECT PINK 34CM XLRG (MISCELLANEOUS) IMPLANT
SLEEVE SCD COMPRESS KNEE MED (MISCELLANEOUS) IMPLANT
SPONGE GAUZE 4X4 12PLY (GAUZE/BANDAGES/DRESSINGS) ×1 IMPLANT
STAPLER VISISTAT 35W (STAPLE) IMPLANT
STRIP CLOSURE SKIN 1/2X4 (GAUZE/BANDAGES/DRESSINGS) ×2 IMPLANT
SUT MNCRL 0 VIOLET CTX 36 (SUTURE) ×2 IMPLANT
SUT MNCRL AB 3-0 PS2 27 (SUTURE) IMPLANT
SUT MONOCRYL 0 CTX 36 (SUTURE) ×5
SUT PDS AB 0 CTX 36 PDP370T (SUTURE) ×2 IMPLANT
SUT PLAIN 0 NONE (SUTURE) IMPLANT
SUT VIC AB 0 CT1 27 (SUTURE) ×2
SUT VIC AB 0 CT1 27XBRD ANBCTR (SUTURE) IMPLANT
SUT VIC AB 2-0 CT1 (SUTURE) IMPLANT
SUT VIC AB 2-0 CT1 27 (SUTURE) ×2
SUT VIC AB 2-0 CT1 TAPERPNT 27 (SUTURE) ×1 IMPLANT
SUT VIC AB 2-0 SH 27 (SUTURE)
SUT VIC AB 2-0 SH 27XBRD (SUTURE) IMPLANT
TAPE CLOTH SURG 4X10 WHT LF (GAUZE/BANDAGES/DRESSINGS) ×1 IMPLANT
TOWEL OR 17X24 6PK STRL BLUE (TOWEL DISPOSABLE) ×4 IMPLANT
TRAY FOLEY CATH 14FR (SET/KITS/TRAYS/PACK) ×2 IMPLANT
WATER STERILE IRR 1000ML POUR (IV SOLUTION) ×2 IMPLANT

## 2011-07-14 NOTE — Anesthesia Postprocedure Evaluation (Signed)
Anesthesia Post Note  Patient: Ashley Nielsen  Procedure(s) Performed: Procedure(s) (LRB): CESAREAN SECTION (N/A)  Anesthesia type: Epidural  Patient location: PACU  Post pain: Pain level controlled  Post assessment: Post-op Vital signs reviewed  Last Vitals:  Filed Vitals:   07/14/11 1739  BP: 137/88  Pulse: 90  Temp: 36.8 C  Resp: 18    Post vital signs: stable  Level of consciousness: awake  Complications: No apparent anesthesia complications

## 2011-07-14 NOTE — Op Note (Signed)
Cesarean Section Procedure Note   Ashley Nielsen   07/13/2011 - 07/14/2011  Indications: Arrest of descent, R/O LGA fetus, pregestational DM   Pre-operative Diagnosis: Arrest of descent, persistent OP, R/O LGA, pre-gestational DM.   Post-operative Diagnosis: Same   Surgeon: Antionette Char A  Assistants: none  Anesthesia: epidural  Procedure Details:  The patient was seen in the Holding Room. The risks, benefits, complications, treatment options, and expected outcomes were discussed with the patient. The patient concurred with the proposed plan, giving informed consent. The patient was identified as Ashley Nielsen and the procedure verified as C-Section Delivery. A Time Out was held and the above information confirmed.  After induction of anesthesia, the patient was draped and prepped in the usual sterile manner. A transverse incision was made and carried down through the subcutaneous tissue to the fascia. The fascial incision was made and extended transversely. The fascia was separated from the underlying rectus tissue superiorly and inferiorly. The peritoneum was identified and entered. The peritoneal incision was extended longitudinally. The utero-vesical peritoneal reflection was incised transversely.  A low transverse uterine incision was made. Delivered from cephalic presentation with elevation from an assistant vaginally was a  living newborn female infant. A cord ph was not sent. The umbilical cord was clamped and cut cord. A sample was obtained for evaluation. The placenta was removed Intact and appeared normal.  The uterine incision was closed with running locked sutures of 1-0 Monocryl. A second imbricating layer of the same suture was placed.   A  2 cm, left lateral extension of the uterine incision was repaired separately, the first layer imbricating first with 1-0 Monocryl suture.  A bladder flap hematoma was over-sewn with figure of eight sutures of 1-0 Monocryl.  Hemostasis was  observed. The paracolic gutters were irrigated. The fascia was then reapproximated with running sutures of 1-0Vicryl. The subcuticular closure was performed using 3-0 Monocryl.  Instrument, sponge, and needle counts were correct prior the abdominal closure and were correct at the conclusion of the case.    Findings:  See above.   Estimated Blood Loss: 1,000 ml  Total IV Fluids:  per Anesthesiology  Urine Output: per Anesthesiology  Specimens: None  Complications: intraoperative hemorrhage  Disposition: PACU - hemodynamically stable.  Maternal Condition: stable   Baby condition / location:  nursery-stable    Signed: Surgeon(s): Roseanna Rainbow, MD

## 2011-07-14 NOTE — Transfer of Care (Signed)
Immediate Anesthesia Transfer of Care Note  Patient: Ashley Nielsen  Procedure(s) Performed: Procedure(s) (LRB): CESAREAN SECTION (N/A)  Patient Location: PACU  Anesthesia Type: Epidural  Level of Consciousness: awake, alert  and oriented  Airway & Oxygen Therapy: Patient Spontanous Breathing  Post-op Assessment: Report given to PACU RN and Post -op Vital signs reviewed and stable  Post vital signs: Reviewed and stable  Complications: No apparent anesthesia complications

## 2011-07-14 NOTE — Progress Notes (Signed)
Ashley Nielsen is a 31 y.o. G1P0000 at [redacted]w[redacted]d by ultrasound admitted for induction of labor due to Gestational diabetes.  Subjective: Comfortable  Objective: BP 138/93  Pulse 126  Temp(Src) 98.9 F (37.2 C) (Oral)  Resp 20  Ht 5\' 10"  (1.778 m)  Wt 134.265 kg (296 lb)  BMI 42.47 kg/m2  SpO2 98%  LMP 10/13/2010      FHT:  FHR: 140 bpm, variability: moderate,  accelerations:  Present,  decelerations:  Absent UC:   irregular, every 2 minutes SVE:   Dilation: 10 Effacement (%): 100 Station: 0 Exam by:: Dr. Terrilyn Saver, molding  Labs: Lab Results  Component Value Date   WBC 10.2 07/13/2011   HGB 10.3* 07/13/2011   HCT 31.4* 07/13/2011   MCV 79.5 07/13/2011   PLT 348 07/13/2011    Assessment / Plan: Arrest of descent  Labor: See above Preeclampsia:  n/a Fetal Wellbeing:  Category I Pain Control:  Epidural I/D:  n/a Anticipated MOD:  C/D  JACKSON-MOORE,Edel Rivero A 07/14/2011, 2:18 PM

## 2011-07-14 NOTE — Progress Notes (Signed)
Ashley Nielsen is a 31 y.o. G1P0000 at [redacted]w[redacted]d by LMP admitted for induction of labor due to Gestational diabetes.  Subjective:   Objective: BP 133/67  Pulse 85  Temp(Src) 98.8 F (37.1 C) (Oral)  Resp 20  Ht 5\' 10"  (1.778 m)  Wt 296 lb (134.265 kg)  BMI 42.47 kg/m2  SpO2 100%  LMP 10/13/2010      FHT:  FHR: 150 bpm, variability: moderate,  accelerations:  Present,  decelerations:  Absent UC:   regular, every 4-5 minutes SVE:   Dilation: 6.5 Effacement (%): 100 Station: -1 Exam by:: Ashley Cheek RN  Labs: Lab Results  Component Value Date   WBC 10.2 07/13/2011   HGB 10.3* 07/13/2011   HCT 31.4* 07/13/2011   MCV 79.5 07/13/2011   PLT 348 07/13/2011    Assessment / Plan: Induction of labor due to gestational diabetes,  progressing well on pitocin  Labor: Progressing normally Preeclampsia:  n/a Fetal Wellbeing:  Category I Pain Control:  Epidural I/D:  n/a Anticipated MOD:  NSVD  Ashley Nielsen A 07/14/2011, 9:07 AM

## 2011-07-15 ENCOUNTER — Encounter (HOSPITAL_COMMUNITY): Payer: Self-pay | Admitting: Obstetrics & Gynecology

## 2011-07-15 LAB — GLUCOSE, CAPILLARY

## 2011-07-15 MED ORDER — SODIUM CHLORIDE 0.9 % IV SOLN
Freq: Once | INTRAVENOUS | Status: AC
  Administered 2011-07-15: 500 mL/h via INTRAVENOUS

## 2011-07-15 MED ORDER — INFLUENZA VIRUS VACC SPLIT PF IM SUSP
0.5000 mL | INTRAMUSCULAR | Status: AC
  Administered 2011-07-16: 0.5 mL via INTRAMUSCULAR
  Filled 2011-07-15: qty 0.5

## 2011-07-15 NOTE — Progress Notes (Signed)
Subjective: Postpartum Day 1: Cesarean Delivery Patient reports tolerating PO.    Objective: Vital signs in last 24 hours: Temp:  [98.1 F (36.7 C)-99.5 F (37.5 C)] 98.1 F (36.7 C) (02/28 0622) Pulse Rate:  [82-126] 93  (02/28 0622) Resp:  [15-31] 20  (02/28 0622) BP: (107-157)/(52-98) 117/74 mmHg (02/28 0622) SpO2:  [96 %-99 %] 99 % (02/28 0622)  Physical Exam:  General: alert and no distress Lochia: appropriate Uterine Fundus: firm Incision: healing well DVT Evaluation: No evidence of DVT seen on physical exam.   Basename 07/15/11 0530 07/14/11 1845 07/14/11 1516  HGB 7.1* 9.4* --  HCT -- 29.3* 29.1*    Assessment/Plan: Status post Cesarean section. Doing well postoperatively.  Continue current care.  Ashley Nielsen A 07/15/2011, 8:51 AM

## 2011-07-15 NOTE — Addendum Note (Signed)
Addendum  created 07/15/11 1610 by Suella Grove, CRNA   Modules edited:Notes Section

## 2011-07-15 NOTE — Anesthesia Postprocedure Evaluation (Signed)
  Anesthesia Post-op Note  Patient: Ashley Nielsen  Procedure(s) Performed: Procedure(s) (LRB): CESAREAN SECTION (N/A)  Patient Location: Mother/Baby  Anesthesia Type: Epidural  Level of Consciousness: awake  Airway and Oxygen Therapy: Patient Spontanous Breathing  Post-op Pain: none  Post-op Assessment: Patient's Cardiovascular Status Stable, Respiratory Function Stable, Patent Airway, No signs of Nausea or vomiting, Adequate PO intake, Pain level controlled, No headache, No backache, No residual numbness and No residual motor weakness  Post-op Vital Signs: Reviewed and stable  Complications: No apparent anesthesia complications

## 2011-07-16 LAB — GLUCOSE, CAPILLARY: Glucose-Capillary: 74 mg/dL (ref 70–99)

## 2011-07-16 MED ORDER — MENTHOL 3 MG MT LOZG
1.0000 | LOZENGE | OROMUCOSAL | Status: DC | PRN
  Filled 2011-07-16: qty 9

## 2011-07-16 NOTE — Progress Notes (Signed)
Subjective: Postpartum Day 2: Cesarean Delivery Patient reports tolerating PO, + flatus and no problems voiding.  Denies HA, blurred vision or RUQ pain. Occasional dizziness with rapid position changes.   Objective: Vital signs in last 24 hours: Temp:  [98 F (36.7 C)-99.1 F (37.3 C)] 98.2 F (36.8 C) (03/01 0555) Pulse Rate:  [89-93] 93  (03/01 0555) Resp:  [18-20] 18  (03/01 0555) BP: (112-122)/(70-86) 113/70 mmHg (03/01 0555) SpO2:  [96 %-100 %] 100 % (02/28 1710)  Physical Exam:  General: alert, cooperative, appears stated age and no distress Lochia: appropriate Uterine Fundus: firm Incision: healing well, no significant drainage, no dehiscence, no significant erythema, steristrips intact. DVT Evaluation: No evidence of DVT seen on physical exam. Negative Homan's sign.   Basename 07/15/11 0530 07/14/11 1845 07/14/11 1516  HGB 7.1* 9.4* --  HCT -- 29.3* 29.1*    Assessment/Plan: Status post Cesarean section. Doing well postoperatively. anemia, to continue on iron supplementation.   Continue current care.  HAMBY, Aryeh Butterfield 07/16/2011, 8:11 AM

## 2011-07-17 DIAGNOSIS — D62 Acute posthemorrhagic anemia: Secondary | ICD-10-CM | POA: Diagnosis not present

## 2011-07-17 LAB — CBC
HCT: 20.2 % — ABNORMAL LOW (ref 36.0–46.0)
Hemoglobin: 6.4 g/dL — CL (ref 12.0–15.0)
MCV: 81.8 fL (ref 78.0–100.0)
RDW: 17.3 % — ABNORMAL HIGH (ref 11.5–15.5)
WBC: 9.8 10*3/uL (ref 4.0–10.5)

## 2011-07-17 LAB — TYPE AND SCREEN

## 2011-07-17 MED ORDER — OXYCODONE-ACETAMINOPHEN 5-325 MG PO TABS: 1.0000 | ORAL_TABLET | Freq: Four times a day (QID) | ORAL | Status: DC | PRN

## 2011-07-17 MED ORDER — FERROUS SULFATE 325 (65 FE) MG PO TABS: 325.0000 mg | ORAL_TABLET | Freq: Two times a day (BID) | ORAL | Status: DC

## 2011-07-17 MED ORDER — IBUPROFEN 600 MG PO TABS: 600.0000 mg | ORAL_TABLET | Freq: Four times a day (QID) | ORAL | Status: DC | PRN

## 2011-07-17 NOTE — Progress Notes (Signed)
Patient ID: Ashley Nielsen, female   DOB: 07/17/1980, 31 y.o.   MRN: [id number] Subjective: POD# 3 s/p Cesarean Delivery.  Indications: failure to progress  RH status/Rubella reviewed. Feeding: breast Patient reports tolerating PO.  Pt c/o dizziness. Breast symptoms: no.  She reports vaginal bleeding as normal, without clots.  She is ambulating, urinating without difficulty.     Objective: Vital signs in last 24 hours: BP 122/77  Pulse 94  Temp(Src) 98.1 F (36.7 C) (Oral)  Resp 18  Ht 5\' 10"  (1.778 m)  Wt 134.265 kg (296 lb)  BMI 42.47 kg/m2  SpO2 100%  LMP 10/13/2010  Breastfeeding? Unknown       Physical Exam:  General: alert CV: Regular rate and rhythm Resp: clear Abdomen: soft, nontender, normal bowel sounds Lochia: minimal Uterine Fundus: firm, below umbilicus, nontender Incision: clean, dry and intact Ext: extremities normal, atraumatic, no cyanosis or edema    Basename 07/15/11 0530 07/14/11 1845 07/14/11 1516  HGB 7.1* 9.4* --  HCT -- 29.3* 29.1*      Assessment/Plan: 31 y.o.  status post Cesarean section. POD# 1.   Orthostatic symptoms.    Discussed with patient a possible blood transfusion with it's attendant risks           Re-check Hgb Orthostatic vital signs  JACKSON-MOORE,Per Beagley A 07/17/2011, 10:35 AM

## 2011-07-17 NOTE — Discharge Summary (Signed)
Obstetric Discharge Summary Reason for Admission: induction of labor Prenatal Procedures: none Intrapartum Procedures: cesarean: low cervical, transverse Postpartum Procedures: none Complications-Operative and Postpartum: hemorrhage Hemoglobin  Date Value Range Status  07/17/2011 6.4* 12.0-15.0 (g/dL) Final     REPEATED TO VERIFY     CRITICAL RESULT CALLED TO, READ BACK BY AND VERIFIED WITH:     Herma Carson, RN AT 1135 07/17/11 BY KJAMES     HCT  Date Value Range Status  07/17/2011 20.2* 36.0-46.0 (%) Final    Discharge Diagnoses: Term Pregnancy-delivered  Discharge Information: Date: 07/17/2011 Activity: pelvic rest Diet: ADA Medications: PNV, Ibuprofen, Iron and Percocet Condition: stable Instructions: see above Discharge to: home Follow-up Information    Follow up with Antionette Char A, MD. Call in 2 weeks.   Contact information:   [address], [address] [place] [address] [phone]          Newborn Data: Live born female  Birth Weight: 8 lb 9.7 oz (3905 g) APGAR: 8, 9  Home with mother.  JACKSON-MOORE,Maxey Ransom A 07/17/2011, 12:16 PM

## 2011-07-17 NOTE — Discharge Instructions (Addendum)
Cesarean Section (Postpartum Care) °These discharge instructions provide you with general information on cesarean section (caesarean, cesarian, caesarian) and caring for yourself after you leave the hospital. Your caregiver may also give you specific instructions. °Please read these instructions and refer to them in the next few weeks. If you have any questions regarding these instructions, you may call the nursing unit. If you have any problems after discharge, please call your doctor. If you are unable to reach your doctor, you should seek help at the nearest Emergency Department. °ACTIVITY °· Rest as much as possible the first two weeks at home.  °· When possible, have someone help you with your household activities and the new baby for 2 to 3 weeks.  °· Limit your housework and social activity. Increase your activity gradually as your strength returns.  °· Do not climb stairs more than two or three times a day.  °· Do not lift anything heavier than your baby.  °· Follow your doctor's instructions about driving a car.  °· Limit wearing support panties or control-top hose, since relying on your own muscles helps strengthen them.  °· Ask your doctor about exercises.  °NUTRITION °· You may return to your usual diet.  °· Drink 6 to 8 glasses of fluid a day.  °· Eat a well-balanced diet. Include portions of food from the meat/protein, milk, fruit, vegetable and bread groups.  °· Keep taking your prenatal or multivitamins.  °ELIMINATION °You should return to your usual bowel function. If constipation is a problem, you may take a mild laxative such as Milk of MagnesiaÔ with your caregiver’s permission. Gradually add fruit, vegetables and bran to your diet. Make sure to increase your fluids. °HYGIENE °You may shower, wash your hair and take tub baths unless your doctor tells you otherwise. Continue peri-care until your vaginal bleeding and discharge stops. Do not douche or use tampons until your caregiver says it is  OK. °FEVER °If you feel feverish or have shaking chills, take your temperature. If your temperature is 101° F (38.3° C), or is 100.4° F (38° C) two times in a four hour period, call your doctor. The fever may indicate infection. If you call early, infection can be treated with medicines that kill germs (antibiotics). Hospitalization may be avoided. °PAIN CONTROL °You may still have mild discomfort. Only take over-the-counter or prescription medicine as directed by your caregiver. Do not take aspirin; it can cause bleeding. If the pain is not relieved by your medicine or becomes worse, call your caregiver. °INCISION CARE °Clean your cut (incision) gently with soap and water. If your caregiver says it is okay, leave the incision without a dressing unless it is draining or irritated. If you have small adhesive strips across the incision and they do not fall off within 7 days, carefully peel them off. Check the incision daily for increased redness, drainage, swelling or separation of skin. Call your caregiver if any of these happen. °VAGINAL CARE °You may have a vaginal discharge or bleeding for up to 6 weeks. If the vaginal discharge becomes bright red, bad smelling, heavy in amount, has blood clots or if you have burning or frequency when urinating, call your caregiver. °SEXUAL INTERCOURSE °It is best to follow your caregiver's advice about when you may safely resume sexual intercourse. Most women can begin to have intercourse two to three weeks after their baby's birth. °You can become pregnant before you have a period. If you decide to have sexual intercourse, you should use   birth control if you do not want to become pregnant right away. ° °BREAST CARE °If you are not breastfeeding and your breasts become tender, hard or leak milk, you may wear a firm fitting bra and apply ice to the breasts. If you are breastfeeding, wear a good support bra. Call your caregiver if you have breast pain, flu-like symptoms, fever or  hardness and reddening of your breasts. °POSTPARTUM BLUES °After the excitement of having the baby goes away, you may commonly have a period of low spirits or “blues." Discuss your feelings with your partner, family and friends. This may be caused by the changing hormone levels in your body. You may want to contact your caregiver if this is worrisome. °SEEK MEDICAL CARE IF: °· There is swelling, redness or increasing pain in the wound area.  °· Pus is coming from the wound.  °· You notice a bad smell from the wound or surgical dressing.  °· You have pain, redness and swelling from the intravenous site.  °· The wound is breaking open (the edges are not staying together).  °· You feel dizzy or feel like fainting.  °· You develop pain or bleeding when you urinate.  °· You develop diarrhea.  °· You develop nausea and vomiting.  °· You develop abnormal vaginal discharge.  °· You develop a rash.  °· You have any type of abnormal reaction or develop an allergy to your medication.  °· You need stronger pain medication for your pain.  °SEEK IMMEDIATE MEDICAL CARE: °· You develop a temperature of 101 or higher.  °· You develop abdominal pain.  °· You develop chest pain.  °· You develop shortness of breath.  °· You pass out.  °· You develop pain, swelling or redness of your leg.  °· You develop heavy vaginal bleeding with or without blood clots.  °Document Released: 01/23/2002 Document Re-Released: 10/21/2009 °Exit[employer]® Patient Information ©2011 [employer]. °Iron Deficiency Anemia °There are many types of anemia. Iron deficiency anemia is the most common. Iron deficiency anemia is a decrease in the number of red blood cells caused by too little iron. Without enough iron, your body does not produce enough hemoglobin. Hemoglobin is a substance in red blood cells that carries oxygen to the body's tissues. Iron deficiency anemia may leave you tired and short of breath. °CAUSES  °· Lack of iron in the diet.  °· This may be seen  in infants and children, because there is little iron in milk.  °· This may be seen in adults who do not eat enough iron-rich foods.  °· This may be seen in pregnant or breastfeeding women who do not take iron supplements. There is a much higher need for iron intake at these times.  °· Poor absorption of iron, as seen with intestinal disorders.  °· Intestinal bleeding.  °· Heavy periods.  °SYMPTOMS  °Mild anemia may not be noticeable. Symptoms may include: °· Fatigue.  °· Headache.  °· Pale skin.  °· Weakness.  °· Shortness of breath.  °· Dizziness.  °· Cold hands and feet.  °· Fast or irregular heartbeat.  °DIAGNOSIS  °Diagnosis requires a thorough evaluation and physical exam by your caregiver. °· Blood tests are generally used to confirm iron deficiency anemia.  °· Additional tests may be done to find the underlying cause of your anemia. These may include:  °· Testing for blood in the stool (fecal occult blood test).  °· A procedure to see inside the colon and rectum (  colonoscopy).  °· A procedure to see inside the esophagus and stomach (endoscopy).  °TREATMENT  °· Correcting the cause of the iron deficiency is the first step.  °· Medicines, such as oral contraceptives, can make heavy menstrual flows lighter.  °· Antibiotics and other medicines can be used to treat peptic ulcers.  °· Surgery may be needed to remove a bleeding polyp, tumor, or fibroid.  °· Often, iron supplements (ferrous sulfate) are taken.  °· For the best iron absorption, take these supplements with an empty stomach.  °· You may need to take the supplements with food if you cannot tolerate them on an empty stomach. Vitamin C improves the absorption of iron. Your caregiver may recommend taking your iron tablets with a glass of orange juice or vitamin C supplement.  °· Milk and antacids should not be taken at the same time as iron supplements. They may interfere with the absorption of iron.  °· Iron supplements can cause constipation. A stool  softener is often recommended.  °· Pregnant and breastfeeding women will need to take extra iron, because their normal diet usually will not provide the required amount.  °· Patients who cannot tolerate iron by mouth can take it through a vein (intravenously) or by an injection into the muscle.  °HOME CARE INSTRUCTIONS  °· Ask your dietitian for help with diet questions.  °· Take iron and vitamins as directed by your caregiver.  °· Eat a diet rich in iron. Eat liver, lean beef, whole-grain bread, eggs, dried fruit, and dark green leafy vegetables.  °SEEK IMMEDIATE MEDICAL CARE IF:  °· You have a fainting episode. Do not drive yourself. Call your local emergency services (911 in [place] if no other help is available.  °· You have chest pain, nausea, or vomiting.  °· You develop severe or increased shortness of breath with activities.  °· You develop weakness or increased thirst.  °· You have a rapid heartbeat.  °· You develop unexplained sweating or become lightheaded when getting up from a chair or bed.  °MAKE SURE YOU:  °· Understand these instructions.  °· Will watch your condition.  °· Will get help right away if you are not doing well or get worse.  °Document Released: 04/30/2000 Document Revised: 01/13/2011 Document Reviewed: 09/09/2009 °ExitCare® Patient Information ©2012 [employer]. °

## 2011-07-21 ENCOUNTER — Inpatient Hospital Stay (HOSPITAL_COMMUNITY)
Admission: AD | Admit: 2011-07-21 | Discharge: 2011-07-21 | Disposition: A | Discharge status: Home / Self Care | Source: Ambulatory Visit | Attending: Obstetrics & Gynecology | Admitting: Obstetrics & Gynecology

## 2011-07-21 ENCOUNTER — Inpatient Hospital Stay (HOSPITAL_COMMUNITY)

## 2011-07-21 ENCOUNTER — Encounter (HOSPITAL_COMMUNITY): Payer: Self-pay | Admitting: *Deleted

## 2011-07-21 ENCOUNTER — Other Ambulatory Visit: Payer: Self-pay

## 2011-07-21 DIAGNOSIS — D649 Anemia, unspecified: Secondary | ICD-10-CM

## 2011-07-21 DIAGNOSIS — N939 Abnormal uterine and vaginal bleeding, unspecified: Secondary | ICD-10-CM

## 2011-07-21 DIAGNOSIS — R42 Dizziness and giddiness: Secondary | ICD-10-CM

## 2011-07-21 DIAGNOSIS — N898 Other specified noninflammatory disorders of vagina: Secondary | ICD-10-CM

## 2011-07-21 DIAGNOSIS — O9081 Anemia of the puerperium: Secondary | ICD-10-CM

## 2011-07-21 LAB — COMPREHENSIVE METABOLIC PANEL
ALT: 16 U/L (ref 0–35)
AST: 27 U/L (ref 0–37)
Alkaline Phosphatase: 91 U/L (ref 39–117)
CO2: 24 mEq/L (ref 19–32)
Calcium: 8.3 mg/dL — ABNORMAL LOW (ref 8.4–10.5)
Chloride: 104 mEq/L (ref 96–112)
GFR calc Af Amer: 76 mL/min — ABNORMAL LOW (ref >90)
GFR calc non Af Amer: 65 mL/min — ABNORMAL LOW (ref >90)
Glucose, Bld: 72 mg/dL (ref 70–99)
Potassium: 4 mEq/L (ref 3.5–5.1)
Sodium: 138 mEq/L (ref 135–145)
Total Bilirubin: 0.2 mg/dL — ABNORMAL LOW (ref 0.3–1.2)

## 2011-07-21 LAB — CBC
Hemoglobin: 6.8 g/dL — CL (ref 12.0–15.0)
MCH: 26 pg (ref 26.0–34.0)
Platelets: 490 10*3/uL — ABNORMAL HIGH (ref 150–400)
RBC: 2.62 MIL/uL — ABNORMAL LOW (ref 3.87–5.11)
WBC: 10.2 10*3/uL (ref 4.0–10.5)

## 2011-07-21 NOTE — ED Notes (Signed)
CRITICAL VALUE ALERT  Critical value received:Hgb 6.8  Date of notification:  07/21/11  Time of notification:  1650  Critical value read back:yes  Nurse who received alert:  K.Lindsea Olivar,RN  MD notified (1st page):  T.Burleson,NP  Time of first page:    MD notified (2nd page):  Time of second page:  Responding MD:   Time MD responded:

## 2011-07-21 NOTE — Progress Notes (Signed)
Pt reports she was sent home from the hospital on Saturday after c-section on 07/14/11. Pt reports she feels some chest pain and swollen feet and Dizymes. Reports vaginal bleeding a little heavier and brighter.

## 2011-07-21 NOTE — ED Provider Notes (Signed)
History     Chief Complaint  Patient presents with  . Shortness of Breath   HPI Ashley Nielsen 31 y.o. postpartum.  Delivered baby by C/S on Wednesday, Feb 27.  Had hemorrhage and had 2 units of blood.  Discharged on 07-17-11 with HGB 6.4 and HCT 20.2.  Comes to [facility]U today with increased pressure in her chest making breathing difficult.  Is having swelling in feet and ankles, tingling in left fingers, and is having heavier vaginal bleeding with clots.  Was dizzy at home and thinks she is bleeding too much.     OB History    Grav Para Term Preterm Abortions TAB SAB Ect Mult Living   1 1 1  0 0 0 0 0 0 1      Past Medical History  Diagnosis Date  . Abnormal Pap smear   . History of tuberculosis 2000    treated  . Gestational diabetes     diet controlled  . Tuberculosis     Past Surgical History  Procedure Date  . Neck lesion biopsy     had tb in lymph node  . Eye surgery     abcess behind L eye  . Cesarean section 07/14/2011    Procedure: CESAREAN SECTION;  Surgeon: Roseanna Rainbow, MD;  Location: [facility];  Service: Gynecology;  Laterality: N/A;    Family History  Problem Relation Age of Onset  . Diabetes Mother   . Hypertension Mother   . Depression Mother   . Hyperlipidemia Mother   . Alcohol abuse Maternal Grandmother   . Alzheimer's disease Maternal Grandmother   . Stroke Maternal Grandmother   . Heart disease Maternal Grandmother   . Learning disabilities Other     autism    History  Substance Use Topics  . Smoking status: Never Smoker   . Smokeless tobacco: Never Used  . Alcohol Use: No    Allergies: No Known Allergies  Prescriptions prior to admission  Medication Sig Dispense Refill  . ferrous sulfate 325 (65 FE) MG tablet Take 1 tablet (325 mg total) by mouth 2 (two) times daily with a meal.  60 tablet  5  . ibuprofen (ADVIL,MOTRIN) 600 MG tablet Take 1 tablet (600 mg total) by mouth every 6 (six) hours as needed for pain.  30 tablet  0  .  oxyCODONE-acetaminophen (PERCOCET) 5-325 MG per tablet Take 1-2 tablets by mouth every 6 (six) hours as needed (moderate - severe pain).  30 tablet  0  . Prenatal Vit-Fe Fumarate-FA (PRENATAL MULTIVITAMIN) TABS Take 1 tablet by mouth daily.        Review of Systems  Respiratory:       Pressure in chest. Difficult to breathe  Gastrointestinal: Negative for abdominal pain.  Genitourinary: Negative for dysuria.       Vaginal bleeding with clots  Neurological: Positive for dizziness.       Numbness in fingers on left hand   Physical Exam   Blood pressure 149/79, pulse 72, temperature 97.9 F (36.6 C), temperature source Oral, resp. rate 18, height 5\' 10"  (1.778 m), last menstrual period 10/13/2010, SpO2 100.00%, unknown if currently breastfeeding.  Physical Exam  Nursing note and vitals reviewed. Constitutional: She is oriented to person, place, and time. She appears well-developed and well-nourished.       obese  HENT:  Head: Normocephalic.  Eyes: EOM are normal.  Neck: Neck supple.  Cardiovascular: Normal rate and regular rhythm.   Respiratory: Effort normal and breath  sounds normal. No respiratory distress. She has no wheezes.       O2 sat 99-100%  GI: Soft. There is no tenderness.  Musculoskeletal: Normal range of motion.       Ankles very swollen - 2-3+ bilaterally  Neurological: She is alert and oriented to person, place, and time.  Skin: Skin is warm and dry. She is not diaphoretic.  Psychiatric: She has a normal mood and affect.    MAU Course  Procedures Results for orders placed during the hospital encounter of 07/21/11 (from the past 24 hour(s))  CBC     Status: Abnormal   Collection Time   07/21/11  4:14 PM      Component Value Range   WBC 10.2  4.0 - 10.5 (K/uL)   RBC 2.62 (*) 3.87 - 5.11 (MIL/uL)   Hemoglobin 6.8 (*) 12.0 - 15.0 (g/dL)   HCT 98.1 (*) 19.1 - 46.0 (%)   MCV 82.4  78.0 - 100.0 (fL)   MCH 26.0  26.0 - 34.0 (pg)   MCHC 31.5  30.0 - 36.0 (g/dL)    RDW 47.8 (*) 29.5 - 15.5 (%)   Platelets 490 (*) 150 - 400 (K/uL)  COMPREHENSIVE METABOLIC PANEL     Status: Abnormal   Collection Time   07/21/11  4:14 PM      Component Value Range   Sodium 138  135 - 145 (mEq/L)   Potassium 4.0  3.5 - 5.1 (mEq/L)   Chloride 104  96 - 112 (mEq/L)   CO2 24  19 - 32 (mEq/L)   Glucose, Bld 72  70 - 99 (mg/dL)   BUN 10  6 - 23 (mg/dL)   Creatinine, Ser 6.21 (*) 0.50 - 1.10 (mg/dL)   Calcium 8.3 (*) 8.4 - 10.5 (mg/dL)   Total Protein 6.3  6.0 - 8.3 (g/dL)   Albumin 2.5 (*) 3.5 - 5.2 (g/dL)   AST 27  0 - 37 (U/L)   ALT 16  0 - 35 (U/L)   Alkaline Phosphatase 91  39 - 117 (U/L)   Total Bilirubin 0.2 (*) 0.3 - 1.2 (mg/dL)   GFR calc non Af Amer 65 (*) >90 (mL/min)   GFR calc Af Amer 76 (*) >90 (mL/min)   EKG - normal sinus rhythm Chest X rays - Minimal bibasilar atelectasis.  And No acute cardiopulmonary disease.   MDM Consult with Dr. Tamela Oddi re: plan of care  Assessment and Plan  Postpartum Anemia Dizziness Vaginal bleeding   Plan: Currently stable Rest as much as possible Eat healthy meals and drink lots of fluids Return if symptoms worsen Keep your appointment in the office  Kacyn Souder 07/21/2011, 4:22 PM   Nolene Bernheim, NP 07/21/11 1842

## 2011-07-21 NOTE — Discharge Instructions (Signed)
Your tests today show that your anemia has not worsened and you are medically stable. Rest as much as possible Eat healthy meals and drink lots of fluids Return if symptoms worsen Keep your appointment in the office

## 2011-08-14 ENCOUNTER — Inpatient Hospital Stay (HOSPITAL_COMMUNITY): Admit: 2011-08-14 | Payer: Self-pay | Admitting: Obstetrics

## 2011-08-14 ENCOUNTER — Inpatient Hospital Stay (HOSPITAL_COMMUNITY): Admission: AD | Admit: 2011-08-14 | Payer: Self-pay | Source: Ambulatory Visit | Admitting: Obstetrics

## 2013-07-02 ENCOUNTER — Encounter: Payer: Self-pay | Admitting: Obstetrics

## 2013-07-02 ENCOUNTER — Ambulatory Visit (INDEPENDENT_AMBULATORY_CARE_PROVIDER_SITE_OTHER): Admitting: Obstetrics

## 2013-07-02 VITALS — BP 131/81 | Temp 98.3°F | Wt 288.0 lb

## 2013-07-02 DIAGNOSIS — Z3201 Encounter for pregnancy test, result positive: Secondary | ICD-10-CM

## 2013-07-02 DIAGNOSIS — Z3480 Encounter for supervision of other normal pregnancy, unspecified trimester: Secondary | ICD-10-CM

## 2013-07-02 DIAGNOSIS — Z3200 Encounter for pregnancy test, result unknown: Secondary | ICD-10-CM

## 2013-07-02 LAB — POCT URINALYSIS DIPSTICK
BILIRUBIN UA: NEGATIVE
Blood, UA: NEGATIVE
GLUCOSE UA: NEGATIVE
Nitrite, UA: NEGATIVE
PH UA: 5
Protein, UA: NEGATIVE
Spec Grav, UA: 1.015
Urobilinogen, UA: NEGATIVE

## 2013-07-02 LAB — POCT URINE PREGNANCY: Preg Test, Ur: POSITIVE

## 2013-07-02 NOTE — Progress Notes (Signed)
Pulse 92  Subjective:    Ashley Nielsen is being seen today for her first obstetrical visit.  This is a planned pregnancy. She is at 674w0d gestation. Her obstetrical history is significant for obesity and gestational diabetes and blood loss.. Relationship with FOB: spouse, living together. Patient does intend to breast feed. Pregnancy history fully reviewed. [place] [employer] [facility] [employer] [place] and records are being obtained.  Pt states that she has had an u/s and results WNL.  Pt states that she did not get diabetic testing this pregnancy.  Pt states that she has had a recent hemoglobin A1C with normal results by Dr. Ellyn HackManjet.  Pt is currently being treated for UTI.  Menstrual History: OB History   Grav Para Term Preterm Abortions TAB SAB Ect Mult Living   2 1 1  0 0 0 0 0 0 1      Menarche age: [age] Patient's last menstrual period was 11/20/2012.    The following portions of the patient's history were reviewed and updated as appropriate: allergies, current medications, past family history, past medical history, past social history, past surgical history and problem list.  Review of Systems Pertinent items are noted in HPI.    Objective:    General appearance: alert and no distress Abdomen: normal findings: soft, non-tender Pelvic: cervix normal in appearance, external genitalia normal, no adnexal masses or tenderness, no cervical motion tenderness, rectovaginal septum normal, vagina normal without discharge and uterus 32 weeks, soft, NT.    Assessment:    Pregnancy at 494w0d weeks    Plan:    Initial labs drawn. Prenatal vitamins.  Counseling provided regarding continued use of seat belts, cessation of alcohol consumption, smoking or use of illicit drugs; infection precautions i.e., influenza/TDAP immunizations, toxoplasmosis,CMV, parvovirus, listeria and varicella; workplace safety, exercise during pregnancy; routine dental care, safe medications, sexual activity, hot tubs, saunas, pools,  travel, caffeine use, fish and methlymercury, potential toxins, hair treatments, varicose veins Weight gain recommendations per IOM guidelines reviewed: underweight/BMI< 18.5--> gain 28 - 40 lbs; normal weight/BMI 18.5 - 24.9--> gain 25 - 35 lbs; overweight/BMI 25 - 29.9--> gain 15 - 25 lbs; obese/BMI >30->gain  11 - 20 lbs Problem list reviewed and updated. FIRST/CF mutation testing/NIPT/QUAD SCREEN discussed: n/a. Role of ultrasound in pregnancy discussed; fetal survey: requested. Amniocentesis discussed: not indicated. VBAC calculator score: VBAC consent form provided Follow up in 2 weeks. 50% of 20 min visit spent on counseling and coordination of care.

## 2013-07-04 ENCOUNTER — Encounter: Payer: Self-pay | Admitting: Obstetrics

## 2013-07-04 LAB — URINE CULTURE
COLONY COUNT: NO GROWTH
ORGANISM ID, BACTERIA: NO GROWTH

## 2013-07-06 ENCOUNTER — Other Ambulatory Visit: Payer: Self-pay | Admitting: *Deleted

## 2013-07-06 DIAGNOSIS — B379 Candidiasis, unspecified: Secondary | ICD-10-CM

## 2013-07-06 MED ORDER — FLUCONAZOLE 150 MG PO TABS: 150.0000 mg | ORAL_TABLET | Freq: Once | ORAL | Status: DC

## 2013-07-15 ENCOUNTER — Encounter: Payer: Self-pay | Admitting: *Deleted

## 2013-07-16 ENCOUNTER — Encounter: Payer: Self-pay | Admitting: Obstetrics

## 2013-07-16 ENCOUNTER — Ambulatory Visit (INDEPENDENT_AMBULATORY_CARE_PROVIDER_SITE_OTHER): Admitting: Obstetrics

## 2013-07-16 VITALS — BP 129/84 | Temp 98.7°F | Wt 288.0 lb

## 2013-07-16 DIAGNOSIS — Z113 Encounter for screening for infections with a predominantly sexual mode of transmission: Secondary | ICD-10-CM

## 2013-07-16 DIAGNOSIS — Z3480 Encounter for supervision of other normal pregnancy, unspecified trimester: Secondary | ICD-10-CM

## 2013-07-16 LAB — POCT URINALYSIS DIPSTICK
Bilirubin, UA: NEGATIVE
Blood, UA: NEGATIVE
Glucose, UA: NEGATIVE
Nitrite, UA: NEGATIVE
Spec Grav, UA: 1.02
UROBILINOGEN UA: NEGATIVE
pH, UA: 5

## 2013-07-16 NOTE — Progress Notes (Signed)
Pulse 90 Pt states that she is having lower pelvic pain.

## 2013-07-17 LAB — OBSTETRIC PANEL
Antibody Screen: NEGATIVE
BASOS ABS: 0 10*3/uL (ref 0.0–0.1)
BASOS PCT: 0 % (ref 0–1)
EOS PCT: 1 % (ref 0–5)
Eosinophils Absolute: 0.1 10*3/uL (ref 0.0–0.7)
HCT: 31.3 % — ABNORMAL LOW (ref 36.0–46.0)
Hemoglobin: 10.4 g/dL — ABNORMAL LOW (ref 12.0–15.0)
Hepatitis B Surface Ag: NEGATIVE
LYMPHS PCT: 22 % (ref 12–46)
Lymphs Abs: 2.1 10*3/uL (ref 0.7–4.0)
MCH: 24.2 pg — ABNORMAL LOW (ref 26.0–34.0)
MCHC: 33.2 g/dL (ref 30.0–36.0)
MCV: 73 fL — ABNORMAL LOW (ref 78.0–100.0)
Monocytes Absolute: 0.5 10*3/uL (ref 0.1–1.0)
Monocytes Relative: 5 % (ref 3–12)
NEUTROS ABS: 6.9 10*3/uL (ref 1.7–7.7)
Neutrophils Relative %: 72 % (ref 43–77)
PLATELETS: 391 10*3/uL (ref 150–400)
RBC: 4.29 MIL/uL (ref 3.87–5.11)
RDW: 17.5 % — AB (ref 11.5–15.5)
RUBELLA: 1.04 {index} — AB (ref <0.90)
Rh Type: POSITIVE
WBC: 9.6 10*3/uL (ref 4.0–10.5)

## 2013-07-17 LAB — GC/CHLAMYDIA PROBE AMP
CT Probe RNA: NEGATIVE
GC Probe RNA: NEGATIVE

## 2013-07-17 LAB — WET PREP BY MOLECULAR PROBE
Candida species: NEGATIVE
Gardnerella vaginalis: POSITIVE — AB
TRICHOMONAS VAG: NEGATIVE

## 2013-07-17 LAB — HIV ANTIBODY (ROUTINE TESTING W REFLEX): HIV: NONREACTIVE

## 2013-07-17 LAB — VARICELLA ZOSTER ANTIBODY, IGG: Varicella IgG: 1248 Index — ABNORMAL HIGH (ref <135.00)

## 2013-07-17 LAB — VITAMIN D 25 HYDROXY (VIT D DEFICIENCY, FRACTURES): VIT D 25 HYDROXY: 24 ng/mL — AB (ref 30–89)

## 2013-07-18 LAB — HEMOGLOBINOPATHY EVALUATION
HGB A2 QUANT: 2.5 % (ref 2.2–3.2)
HGB F QUANT: 0 % (ref 0.0–2.0)
HGB S QUANTITAION: 0 %
Hemoglobin Other: 0 %
Hgb A: 97.5 % (ref 96.8–97.8)

## 2013-07-18 LAB — STREP B DNA PROBE: STREP GROUP B AG: POSITIVE

## 2013-07-23 ENCOUNTER — Ambulatory Visit (INDEPENDENT_AMBULATORY_CARE_PROVIDER_SITE_OTHER): Admitting: Obstetrics

## 2013-07-23 ENCOUNTER — Encounter: Payer: Self-pay | Admitting: Obstetrics

## 2013-07-23 VITALS — BP 133/82 | Temp 98.2°F | Wt 292.0 lb

## 2013-07-23 DIAGNOSIS — B9689 Other specified bacterial agents as the cause of diseases classified elsewhere: Secondary | ICD-10-CM

## 2013-07-23 DIAGNOSIS — A499 Bacterial infection, unspecified: Secondary | ICD-10-CM

## 2013-07-23 DIAGNOSIS — Z3480 Encounter for supervision of other normal pregnancy, unspecified trimester: Secondary | ICD-10-CM

## 2013-07-23 DIAGNOSIS — N760 Acute vaginitis: Secondary | ICD-10-CM | POA: Insufficient documentation

## 2013-07-23 DIAGNOSIS — K219 Gastro-esophageal reflux disease without esophagitis: Secondary | ICD-10-CM | POA: Insufficient documentation

## 2013-07-23 LAB — POCT URINALYSIS DIPSTICK
Bilirubin, UA: NEGATIVE
Blood, UA: NEGATIVE
Glucose, UA: NEGATIVE
NITRITE UA: NEGATIVE
PH UA: 5
Spec Grav, UA: 1.02
UROBILINOGEN UA: NEGATIVE

## 2013-07-23 MED ORDER — OMEPRAZOLE 20 MG PO CPDR: 20.0000 mg | DELAYED_RELEASE_CAPSULE | Freq: Two times a day (BID) | ORAL | Status: DC

## 2013-07-23 MED ORDER — METRONIDAZOLE 500 MG PO TABS: 500.0000 mg | ORAL_TABLET | Freq: Two times a day (BID) | ORAL | Status: DC

## 2013-07-23 NOTE — Progress Notes (Signed)
Pulse 80 Pt states that she has recently been having chills and shakiness.  Pt states that this has happened a couple times since last visit.    Pt states that she has a 34 year old that likes to jump in her lap.  Pt would like reassurance that baby is ok. Pt states that she has some lower pelvic and some back pain.  Pt states it gets worse when moving around.  Pt states that she will sometimes become short of breath when walking around.  Pt states that she is having some dry mouth despite the amount of water she drinks.  Labs were reviewed with pt.  Discussed Vit D level and Bacterial infection.  Pt hasn't yet been treated for BV. Metronidazole 500mg  sent to pharmacy today.

## 2013-07-24 ENCOUNTER — Other Ambulatory Visit: Payer: Self-pay | Admitting: *Deleted

## 2013-07-24 DIAGNOSIS — E559 Vitamin D deficiency, unspecified: Secondary | ICD-10-CM

## 2013-07-24 MED ORDER — OB COMPLETE PETITE 35-5-1-200 MG PO CAPS: 1.0000 | ORAL_CAPSULE | Freq: Every day | ORAL | Status: DC

## 2013-07-30 ENCOUNTER — Ambulatory Visit (INDEPENDENT_AMBULATORY_CARE_PROVIDER_SITE_OTHER): Admitting: Obstetrics

## 2013-07-30 VITALS — BP 145/83 | Temp 97.9°F | Wt 294.0 lb

## 2013-07-30 DIAGNOSIS — J302 Other seasonal allergic rhinitis: Secondary | ICD-10-CM

## 2013-07-30 DIAGNOSIS — Z3480 Encounter for supervision of other normal pregnancy, unspecified trimester: Secondary | ICD-10-CM

## 2013-07-30 DIAGNOSIS — J309 Allergic rhinitis, unspecified: Secondary | ICD-10-CM

## 2013-07-30 LAB — POCT URINALYSIS DIPSTICK
BILIRUBIN UA: NEGATIVE
Glucose, UA: NEGATIVE
Ketones, UA: NEGATIVE
LEUKOCYTES UA: NEGATIVE
NITRITE UA: NEGATIVE
PH UA: 5
RBC UA: NEGATIVE
Spec Grav, UA: 1.015
UROBILINOGEN UA: NEGATIVE

## 2013-07-30 MED ORDER — LORATADINE 10 MG PO TABS: 10.0000 mg | ORAL_TABLET | Freq: Every day | ORAL | Status: DC | PRN

## 2013-07-30 NOTE — Progress Notes (Signed)
Pulse: 86 Patient states she is having lower abdominal and pelvic pain.  Patient states she has been having cramping throughout the day for the past couple of days.

## 2013-07-31 ENCOUNTER — Encounter: Payer: Self-pay | Admitting: Obstetrics

## 2013-08-03 ENCOUNTER — Encounter (HOSPITAL_COMMUNITY): Payer: Self-pay | Admitting: *Deleted

## 2013-08-03 ENCOUNTER — Inpatient Hospital Stay (HOSPITAL_COMMUNITY)
Admission: AD | Admit: 2013-08-03 | Discharge: 2013-08-03 | Disposition: A | Discharge status: Home / Self Care | Source: Ambulatory Visit | Attending: Obstetrics & Gynecology | Admitting: Obstetrics & Gynecology

## 2013-08-03 DIAGNOSIS — O99891 Other specified diseases and conditions complicating pregnancy: Secondary | ICD-10-CM | POA: Insufficient documentation

## 2013-08-03 DIAGNOSIS — O479 False labor, unspecified: Secondary | ICD-10-CM | POA: Insufficient documentation

## 2013-08-03 DIAGNOSIS — O212 Late vomiting of pregnancy: Secondary | ICD-10-CM | POA: Insufficient documentation

## 2013-08-03 DIAGNOSIS — O9989 Other specified diseases and conditions complicating pregnancy, childbirth and the puerperium: Secondary | ICD-10-CM

## 2013-08-03 DIAGNOSIS — Z22330 Carrier of Group B streptococcus: Secondary | ICD-10-CM | POA: Insufficient documentation

## 2013-08-03 LAB — AMNISURE RUPTURE OF MEMBRANE (ROM) NOT AT ARMC: Amnisure ROM: NEGATIVE

## 2013-08-03 LAB — POCT FERN TEST

## 2013-08-03 NOTE — Discharge Instructions (Signed)
Fetal Movement Counts °Patient Name: __________________________________________________ Patient Due Date: ____________________ °Performing a fetal movement count is highly recommended in high-risk pregnancies, but it is good for every pregnant woman to do. Your caregiver may ask you to start counting fetal movements at 28 weeks of the pregnancy. Fetal movements often increase: °· After eating a full meal. °· After physical activity. °· After eating or drinking something sweet or cold. °· At rest. °Pay attention to when you feel the baby is most active. This will help you notice a pattern of your baby's sleep and wake cycles and what factors contribute to an increase in fetal movement. It is important to perform a fetal movement count at the same time each day when your baby is normally most active.  °HOW TO COUNT FETAL MOVEMENTS °1. Find a quiet and comfortable area to sit or lie down on your left side. Lying on your left side provides the best blood and oxygen circulation to your baby. °2. Write down the day and time on a sheet of paper or in a journal. °3. Start counting kicks, flutters, swishes, rolls, or jabs in a 2 hour period. You should feel at least 10 movements within 2 hours. °4. If you do not feel 10 movements in 2 hours, wait 2 3 hours and count again. Look for a change in the pattern or not enough counts in 2 hours. °SEEK MEDICAL CARE IF: °· You feel less than 10 counts in 2 hours, tried twice. °· There is no movement in over an hour. °· The pattern is changing or taking longer each day to reach 10 counts in 2 hours. °· You feel the baby is not moving as he or she usually does. °Date: ____________ Movements: ____________ Start time: ____________ Finish time: ____________  °Date: ____________ Movements: ____________ Start time: ____________ Finish time: ____________ °Date: ____________ Movements: ____________ Start time: ____________ Finish time: ____________ °Date: ____________ Movements: ____________  Start time: ____________ Finish time: ____________ °Date: ____________ Movements: ____________ Start time: ____________ Finish time: ____________ °Date: ____________ Movements: ____________ Start time: ____________ Finish time: ____________ °Date: ____________ Movements: ____________ Start time: ____________ Finish time: ____________ °Date: ____________ Movements: ____________ Start time: ____________ Finish time: ____________  °Date: ____________ Movements: ____________ Start time: ____________ Finish time: ____________ °Date: ____________ Movements: ____________ Start time: ____________ Finish time: ____________ °Date: ____________ Movements: ____________ Start time: ____________ Finish time: ____________ °Date: ____________ Movements: ____________ Start time: ____________ Finish time: ____________ °Date: ____________ Movements: ____________ Start time: ____________ Finish time: ____________ °Date: ____________ Movements: ____________ Start time: ____________ Finish time: ____________ °Date: ____________ Movements: ____________ Start time: ____________ Finish time: ____________  °Date: ____________ Movements: ____________ Start time: ____________ Finish time: ____________ °Date: ____________ Movements: ____________ Start time: ____________ Finish time: ____________ °Date: ____________ Movements: ____________ Start time: ____________ Finish time: ____________ °Date: ____________ Movements: ____________ Start time: ____________ Finish time: ____________ °Date: ____________ Movements: ____________ Start time: ____________ Finish time: ____________ °Date: ____________ Movements: ____________ Start time: ____________ Finish time: ____________ °Date: ____________ Movements: ____________ Start time: ____________ Finish time: ____________  °Date: ____________ Movements: ____________ Start time: ____________ Finish time: ____________ °Date: ____________ Movements: ____________ Start time: ____________ Finish time:  ____________ °Date: ____________ Movements: ____________ Start time: ____________ Finish time: ____________ °Date: ____________ Movements: ____________ Start time: ____________ Finish time: ____________ °Date: ____________ Movements: ____________ Start time: ____________ Finish time: ____________ °Date: ____________ Movements: ____________ Start time: ____________ Finish time: ____________ °Date: ____________ Movements: ____________ Start time: ____________ Finish time: ____________  °Date: ____________ Movements: ____________ Start time: ____________ Finish   time: ____________ °Date: ____________ Movements: ____________ Start time: ____________ Finish time: ____________ °Date: ____________ Movements: ____________ Start time: ____________ Finish time: ____________ °Date: ____________ Movements: ____________ Start time: ____________ Finish time: ____________ °Date: ____________ Movements: ____________ Start time: ____________ Finish time: ____________ °Date: ____________ Movements: ____________ Start time: ____________ Finish time: ____________ °Date: ____________ Movements: ____________ Start time: ____________ Finish time: ____________  °Date: ____________ Movements: ____________ Start time: ____________ Finish time: ____________ °Date: ____________ Movements: ____________ Start time: ____________ Finish time: ____________ °Date: ____________ Movements: ____________ Start time: ____________ Finish time: ____________ °Date: ____________ Movements: ____________ Start time: ____________ Finish time: ____________ °Date: ____________ Movements: ____________ Start time: ____________ Finish time: ____________ °Date: ____________ Movements: ____________ Start time: ____________ Finish time: ____________ °Date: ____________ Movements: ____________ Start time: ____________ Finish time: ____________  °Date: ____________ Movements: ____________ Start time: ____________ Finish time: ____________ °Date: ____________ Movements:  ____________ Start time: ____________ Finish time: ____________ °Date: ____________ Movements: ____________ Start time: ____________ Finish time: ____________ °Date: ____________ Movements: ____________ Start time: ____________ Finish time: ____________ °Date: ____________ Movements: ____________ Start time: ____________ Finish time: ____________ °Date: ____________ Movements: ____________ Start time: ____________ Finish time: ____________ °Date: ____________ Movements: ____________ Start time: ____________ Finish time: ____________  °Date: ____________ Movements: ____________ Start time: ____________ Finish time: ____________ °Date: ____________ Movements: ____________ Start time: ____________ Finish time: ____________ °Date: ____________ Movements: ____________ Start time: ____________ Finish time: ____________ °Date: ____________ Movements: ____________ Start time: ____________ Finish time: ____________ °Date: ____________ Movements: ____________ Start time: ____________ Finish time: ____________ °Date: ____________ Movements: ____________ Start time: ____________ Finish time: ____________ °Document Released: 06/02/2006 Document Revised: 04/19/2012 Document Reviewed: 02/28/2012 °Exit[employer]® Patient Information ©2014 [employer]. ° ° °Braxton Hicks Contractions °Pregnancy is commonly associated with contractions of the uterus throughout the pregnancy. Towards the end of pregnancy (32 to 34 weeks), these contractions (Braxton Hicks) can develop more often and may become more forceful. This is not true labor because these contractions do not result in opening (dilatation) and thinning of the cervix. They are sometimes difficult to tell apart from true labor because these contractions can be forceful and people have different pain tolerances. You should not feel embarrassed if you go to the hospital with false labor. Sometimes, the only way to tell if you are in true labor is for your caregiver to follow the changes  in the cervix. °How to tell the difference between true and false labor: °· False labor. °· The contractions of false labor are usually shorter, irregular and not as hard as those of true labor. °· They are often felt in the front of the lower abdomen and in the groin. °· They may leave with walking around or changing positions while lying down. °· They get weaker and are shorter lasting as time goes on. °· These contractions are usually irregular. °· They do not usually become progressively stronger, regular and closer together as with true labor. °· True labor. °· Contractions in true labor last 30 to 70 seconds, become very regular, usually become more intense, and increase in frequency. °· They do not go away with walking. °· The discomfort is usually felt in the top of the uterus and spreads to the lower abdomen and low back. °· True labor can be determined by your caregiver with an exam. This will show that the cervix is dilating and getting thinner. °If there are no prenatal problems or other health problems associated with the pregnancy, it is completely safe to be sent home with false labor and await the onset of   true labor. °HOME CARE INSTRUCTIONS  °· Keep up with your usual exercises and instructions. °· Take medications as directed. °· Keep your regular prenatal appointment. °· Eat and drink lightly if you think you are going into labor. °· If BH contractions are making you uncomfortable: °· Change your activity position from lying down or resting to walking/walking to resting. °· Sit and rest in a tub of warm water. °· Drink 2 to 3 glasses of water. Dehydration may cause B-H contractions. °· Do slow and deep breathing several times an hour. °SEEK IMMEDIATE MEDICAL CARE IF:  °· Your contractions continue to become stronger, more regular, and closer together. °· You have a gushing, burst or leaking of fluid from the vagina. °· An oral temperature above 102° F (38.9° C) develops. °· You have passage of  blood-tinged mucus. °· You develop vaginal bleeding. °· You develop continuous belly (abdominal) pain. °· You have low back pain that you never had before. °· You feel the baby's head pushing down causing pelvic pressure. °· The baby is not moving as much as it used to. °Document Released: 05/03/2005 Document Revised: 07/26/2011 Document Reviewed: 02/12/2013 °ExitCare® Patient Information ©2014 [employer]. ° ° °

## 2013-08-03 NOTE — MAU Note (Signed)
About 1400 i had taken a bath and when i stood up felt a gush of fld. It wasn't from the bath. I had alittle wetness in my panties alittle later but didn't think much about it until i felt light contractions. I'm GBS positive so thought i should get it checked out

## 2013-08-03 NOTE — MAU Note (Signed)
Pt. Took a bath around 11am and had a gush of clear fluid at that time. Pt. States she has been having irregular contractions today. Baby has been moving well. Denies any bleeding. Did have a white creamy discharge today before coming here. Pt. Has had wet underwear earlier today and not had to wear a pad. Pt. Is nauseated and states this has been going on during her whole pregnancy. Pt. Saw OB on Monday and next appointment is scheduled for Thursday. Did have not have a SVE done at last visit per pt.

## 2013-08-09 ENCOUNTER — Encounter: Payer: Self-pay | Admitting: Obstetrics

## 2013-08-09 ENCOUNTER — Ambulatory Visit (INDEPENDENT_AMBULATORY_CARE_PROVIDER_SITE_OTHER): Admitting: Obstetrics

## 2013-08-09 VITALS — BP 133/87 | Temp 98.6°F | Wt 292.0 lb

## 2013-08-09 DIAGNOSIS — Z3480 Encounter for supervision of other normal pregnancy, unspecified trimester: Secondary | ICD-10-CM

## 2013-08-09 LAB — POCT URINALYSIS DIPSTICK
BILIRUBIN UA: NEGATIVE
Glucose, UA: NEGATIVE
KETONES UA: NEGATIVE
LEUKOCYTES UA: NEGATIVE
Nitrite, UA: NEGATIVE
PH UA: 5
RBC UA: NEGATIVE
SPEC GRAV UA: 1.015
Urobilinogen, UA: NEGATIVE

## 2013-08-09 NOTE — Progress Notes (Signed)
Pulse 88 Pt was seen in MAU on Saturday.  Pt states that she thought her water had broke.  Pt was advised water intact and d/c home.

## 2013-08-16 ENCOUNTER — Ambulatory Visit (INDEPENDENT_AMBULATORY_CARE_PROVIDER_SITE_OTHER): Admitting: Obstetrics

## 2013-08-16 VITALS — BP 144/87 | Temp 98.7°F | Wt 289.0 lb

## 2013-08-16 DIAGNOSIS — Z3480 Encounter for supervision of other normal pregnancy, unspecified trimester: Secondary | ICD-10-CM

## 2013-08-16 LAB — POCT URINALYSIS DIPSTICK
BILIRUBIN UA: NEGATIVE
GLUCOSE UA: NEGATIVE
LEUKOCYTES UA: NEGATIVE
NITRITE UA: NEGATIVE
PH UA: 5
RBC UA: NEGATIVE
Spec Grav, UA: 1.02
Urobilinogen, UA: NEGATIVE

## 2013-08-16 NOTE — Progress Notes (Signed)
Pulse: 99 Patient states she is having lower abdominal pain and pressure. Patient would like to schedule the c-section today. Patient states Dr. Clearance CootsHarper wants to schedule it a week before he due date which would be Monday.

## 2013-08-20 ENCOUNTER — Other Ambulatory Visit: Payer: Self-pay | Admitting: *Deleted

## 2013-08-20 ENCOUNTER — Encounter: Payer: Self-pay | Admitting: Obstetrics

## 2013-08-21 ENCOUNTER — Encounter (HOSPITAL_COMMUNITY): Payer: Self-pay | Admitting: Pharmacist

## 2013-08-21 ENCOUNTER — Encounter (HOSPITAL_COMMUNITY): Payer: Self-pay

## 2013-08-21 ENCOUNTER — Encounter (HOSPITAL_COMMUNITY)
Admission: RE | Admit: 2013-08-21 | Discharge: 2013-08-21 | Disposition: A | Discharge status: Home / Self Care | Source: Ambulatory Visit | Attending: Obstetrics | Admitting: Obstetrics

## 2013-08-21 LAB — CBC
HEMATOCRIT: 30.2 % — AB (ref 36.0–46.0)
HEMOGLOBIN: 9.5 g/dL — AB (ref 12.0–15.0)
MCH: 23.2 pg — AB (ref 26.0–34.0)
MCHC: 31.5 g/dL (ref 30.0–36.0)
MCV: 73.7 fL — ABNORMAL LOW (ref 78.0–100.0)
Platelets: 400 10*3/uL (ref 150–400)
RBC: 4.1 MIL/uL (ref 3.87–5.11)
RDW: 16.2 % — ABNORMAL HIGH (ref 11.5–15.5)
WBC: 9.7 10*3/uL (ref 4.0–10.5)

## 2013-08-21 LAB — RPR

## 2013-08-21 LAB — TYPE AND SCREEN
ABO/RH(D): AB POS
ANTIBODY SCREEN: NEGATIVE

## 2013-08-21 MED ORDER — DEXTROSE 5 % IV SOLN
3.0000 g | INTRAVENOUS | Status: AC
  Administered 2013-08-22: 3 g via INTRAVENOUS
  Filled 2013-08-21: qty 3000

## 2013-08-21 NOTE — Patient Instructions (Signed)
19 Ashley Nielsen  08/21/2013   Your procedure is scheduled on:  08/22/13  Enter through the [facility] 930 AM.  Pick up the phone at the desk and dial [phone].   Call this number if you have problems the morning of surgery: [phone]   Remember:   Do not eat food:After Midnight.  Do not drink clear liquids: After Midnight.  Take these medicines the morning of surgery with A SIP OF WATER: NA   Do not wear jewelry, make-up or nail polish.  Do not wear lotions, powders, or perfumes. You may wear deodorant.  Do not shave 48 hours prior to surgery.  Do not bring valuables to the hospital.  [facility] is not   responsible for any belongings or valuables brought to the hospital.  Contacts, dentures or bridgework may not be worn into surgery.  Leave suitcase in the car. After surgery it may be brought to your room.  For patients admitted to the hospital, checkout time is 11:00 AM the day of              discharge.   Patients discharged the day of surgery will not be allowed to drive             home.  Name and phone number of your driver: NA  Special Instructions:      Please read over the following fact sheets that you were given:   Surgical Site Infection Prevention

## 2013-08-22 ENCOUNTER — Inpatient Hospital Stay (HOSPITAL_COMMUNITY): Admitting: Anesthesiology

## 2013-08-22 ENCOUNTER — Encounter (HOSPITAL_COMMUNITY): Payer: Self-pay | Admitting: *Deleted

## 2013-08-22 ENCOUNTER — Encounter (HOSPITAL_COMMUNITY): Admitting: Anesthesiology

## 2013-08-22 ENCOUNTER — Inpatient Hospital Stay (HOSPITAL_COMMUNITY)
Admission: RE | Admit: 2013-08-22 | Discharge: 2013-08-25 | DRG: 766 | Disposition: A | Discharge status: Home / Self Care | Source: Ambulatory Visit | Attending: Obstetrics | Admitting: Obstetrics

## 2013-08-22 ENCOUNTER — Encounter (HOSPITAL_COMMUNITY)
Admission: RE | Disposition: A | Discharge status: Home / Self Care | Payer: Self-pay | Source: Ambulatory Visit | Attending: Obstetrics

## 2013-08-22 DIAGNOSIS — Z823 Family history of stroke: Secondary | ICD-10-CM

## 2013-08-22 DIAGNOSIS — O9902 Anemia complicating childbirth: Secondary | ICD-10-CM | POA: Diagnosis present

## 2013-08-22 DIAGNOSIS — Z820 Family history of epilepsy and other diseases of the nervous system: Secondary | ICD-10-CM

## 2013-08-22 DIAGNOSIS — Z8611 Personal history of tuberculosis: Secondary | ICD-10-CM

## 2013-08-22 DIAGNOSIS — Z98891 History of uterine scar from previous surgery: Secondary | ICD-10-CM

## 2013-08-22 DIAGNOSIS — Z22330 Carrier of Group B streptococcus: Secondary | ICD-10-CM

## 2013-08-22 DIAGNOSIS — Z833 Family history of diabetes mellitus: Secondary | ICD-10-CM

## 2013-08-22 DIAGNOSIS — O34219 Maternal care for unspecified type scar from previous cesarean delivery: Principal | ICD-10-CM | POA: Diagnosis present

## 2013-08-22 DIAGNOSIS — E669 Obesity, unspecified: Secondary | ICD-10-CM | POA: Diagnosis present

## 2013-08-22 DIAGNOSIS — O99214 Obesity complicating childbirth: Secondary | ICD-10-CM

## 2013-08-22 DIAGNOSIS — Z8249 Family history of ischemic heart disease and other diseases of the circulatory system: Secondary | ICD-10-CM

## 2013-08-22 DIAGNOSIS — Z8632 Personal history of gestational diabetes: Secondary | ICD-10-CM

## 2013-08-22 DIAGNOSIS — O9989 Other specified diseases and conditions complicating pregnancy, childbirth and the puerperium: Secondary | ICD-10-CM

## 2013-08-22 DIAGNOSIS — D649 Anemia, unspecified: Secondary | ICD-10-CM | POA: Diagnosis present

## 2013-08-22 DIAGNOSIS — O99892 Other specified diseases and conditions complicating childbirth: Secondary | ICD-10-CM | POA: Diagnosis present

## 2013-08-22 SURGERY — Surgical Case
Anesthesia: Spinal | Site: Abdomen

## 2013-08-22 MED ORDER — LACTATED RINGERS IV SOLN
INTRAVENOUS | Status: DC | PRN
  Administered 2013-08-22 (×2): via INTRAVENOUS

## 2013-08-22 MED ORDER — DIPHENHYDRAMINE HCL 50 MG/ML IJ SOLN
12.5000 mg | INTRAMUSCULAR | Status: DC | PRN
  Administered 2013-08-22: 12.5 mg via INTRAVENOUS

## 2013-08-22 MED ORDER — MORPHINE SULFATE (PF) 0.5 MG/ML IJ SOLN
INTRAMUSCULAR | Status: DC | PRN
  Administered 2013-08-22: .2 mg via INTRATHECAL

## 2013-08-22 MED ORDER — OXYTOCIN 10 UNIT/ML IJ SOLN
INTRAMUSCULAR | Status: AC
  Filled 2013-08-22: qty 4

## 2013-08-22 MED ORDER — MORPHINE SULFATE 0.5 MG/ML IJ SOLN
INTRAMUSCULAR | Status: AC
  Filled 2013-08-22: qty 10

## 2013-08-22 MED ORDER — DIPHENHYDRAMINE HCL 50 MG/ML IJ SOLN: 25.0000 mg | INTRAMUSCULAR | Status: DC | PRN

## 2013-08-22 MED ORDER — CEFAZOLIN SODIUM-DEXTROSE 2-3 GM-% IV SOLR
INTRAVENOUS | Status: AC
  Filled 2013-08-22: qty 50

## 2013-08-22 MED ORDER — LACTATED RINGERS IV SOLN
INTRAVENOUS | Status: DC | PRN
  Administered 2013-08-22 (×3): via INTRAVENOUS

## 2013-08-22 MED ORDER — DIPHENHYDRAMINE HCL 50 MG/ML IJ SOLN
INTRAMUSCULAR | Status: AC
  Filled 2013-08-22: qty 1

## 2013-08-22 MED ORDER — ONDANSETRON HCL 4 MG/2ML IJ SOLN: 4.0000 mg | Freq: Three times a day (TID) | INTRAMUSCULAR | Status: DC | PRN

## 2013-08-22 MED ORDER — BUPIVACAINE HCL (PF) 0.25 % IJ SOLN
INTRAMUSCULAR | Status: AC
  Filled 2013-08-22: qty 30

## 2013-08-22 MED ORDER — METOCLOPRAMIDE HCL 5 MG/ML IJ SOLN: 10.0000 mg | Freq: Three times a day (TID) | INTRAMUSCULAR | Status: DC | PRN

## 2013-08-22 MED ORDER — FENTANYL CITRATE 0.05 MG/ML IJ SOLN
INTRAMUSCULAR | Status: DC | PRN
  Administered 2013-08-22: 25 ug via INTRATHECAL

## 2013-08-22 MED ORDER — PHENYLEPHRINE 8 MG IN D5W 100 ML (0.08MG/ML) PREMIX OPTIME
INJECTION | INTRAVENOUS | Status: DC | PRN
  Administered 2013-08-22: 40 ug/min via INTRAVENOUS

## 2013-08-22 MED ORDER — LACTATED RINGERS IV SOLN
INTRAVENOUS | Status: DC
  Administered 2013-08-22 – 2013-08-23 (×3): via INTRAVENOUS

## 2013-08-22 MED ORDER — NALOXONE HCL 0.4 MG/ML IJ SOLN: 0.4000 mg | INTRAMUSCULAR | Status: DC | PRN

## 2013-08-22 MED ORDER — FENTANYL CITRATE 0.05 MG/ML IJ SOLN: 25.0000 ug | INTRAMUSCULAR | Status: DC | PRN

## 2013-08-22 MED ORDER — SCOPOLAMINE 1 MG/3DAYS TD PT72
1.0000 | MEDICATED_PATCH | Freq: Once | TRANSDERMAL | Status: DC
  Administered 2013-08-22: 1.5 mg via TRANSDERMAL

## 2013-08-22 MED ORDER — NALOXONE HCL 1 MG/ML IJ SOLN: 1.0000 ug/kg/h | INTRAVENOUS | Status: DC | PRN

## 2013-08-22 MED ORDER — LACTATED RINGERS IV SOLN
Freq: Once | INTRAVENOUS | Status: AC
  Administered 2013-08-22: 10:00:00 via INTRAVENOUS

## 2013-08-22 MED ORDER — ONDANSETRON HCL 4 MG/2ML IJ SOLN
INTRAMUSCULAR | Status: DC | PRN
  Administered 2013-08-22: 4 mg via INTRAVENOUS

## 2013-08-22 MED ORDER — PHENYLEPHRINE HCL 10 MG/ML IJ SOLN
INTRAMUSCULAR | Status: AC
  Filled 2013-08-22: qty 1

## 2013-08-22 MED ORDER — ONDANSETRON HCL 4 MG/2ML IJ SOLN
INTRAMUSCULAR | Status: AC
  Filled 2013-08-22: qty 2

## 2013-08-22 MED ORDER — NALBUPHINE HCL 10 MG/ML IJ SOLN
5.0000 mg | INTRAMUSCULAR | Status: DC | PRN
  Administered 2013-08-22: 10 mg via INTRAVENOUS

## 2013-08-22 MED ORDER — SCOPOLAMINE 1 MG/3DAYS TD PT72
MEDICATED_PATCH | TRANSDERMAL | Status: AC
  Administered 2013-08-22: 1.5 mg via TRANSDERMAL
  Filled 2013-08-22: qty 1

## 2013-08-22 MED ORDER — KETOROLAC TROMETHAMINE 30 MG/ML IJ SOLN
INTRAMUSCULAR | Status: AC
  Administered 2013-08-22: 30 mg via INTRAMUSCULAR
  Filled 2013-08-22: qty 1

## 2013-08-22 MED ORDER — OXYTOCIN 10 UNIT/ML IJ SOLN
40.0000 [IU] | INTRAVENOUS | Status: DC | PRN
  Administered 2013-08-22: 40 [IU] via INTRAVENOUS

## 2013-08-22 MED ORDER — KETOROLAC TROMETHAMINE 30 MG/ML IJ SOLN
30.0000 mg | Freq: Four times a day (QID) | INTRAMUSCULAR | Status: DC | PRN
  Administered 2013-08-23: 30 mg via INTRAVENOUS
  Filled 2013-08-22: qty 1

## 2013-08-22 MED ORDER — KETOROLAC TROMETHAMINE 30 MG/ML IJ SOLN
30.0000 mg | Freq: Four times a day (QID) | INTRAMUSCULAR | Status: DC | PRN
Start: 2013-08-22 — End: 2013-08-23
  Administered 2013-08-22: 30 mg via INTRAMUSCULAR

## 2013-08-22 MED ORDER — SODIUM CHLORIDE 0.9 % IJ SOLN: 3.0000 mL | INTRAMUSCULAR | Status: DC | PRN

## 2013-08-22 MED ORDER — FENTANYL CITRATE 0.05 MG/ML IJ SOLN
INTRAMUSCULAR | Status: AC
  Filled 2013-08-22: qty 2

## 2013-08-22 MED ORDER — NALBUPHINE HCL 10 MG/ML IJ SOLN
INTRAMUSCULAR | Status: AC
  Administered 2013-08-22: 10 mg via SUBCUTANEOUS
  Filled 2013-08-22: qty 1

## 2013-08-22 MED ORDER — DIPHENHYDRAMINE HCL 25 MG PO CAPS
25.0000 mg | ORAL_CAPSULE | ORAL | Status: DC | PRN
Start: 2013-08-22 — End: 2013-08-23
  Filled 2013-08-22: qty 1

## 2013-08-22 MED ORDER — SCOPOLAMINE 1 MG/3DAYS TD PT72: 1.0000 | MEDICATED_PATCH | Freq: Once | TRANSDERMAL | Status: DC

## 2013-08-22 MED ORDER — MEPERIDINE HCL 25 MG/ML IJ SOLN: 6.2500 mg | INTRAMUSCULAR | Status: DC | PRN

## 2013-08-22 MED ORDER — BUPIVACAINE IN DEXTROSE 0.75-8.25 % IT SOLN
INTRATHECAL | Status: DC | PRN
  Administered 2013-08-22: 1.8 mL via INTRATHECAL

## 2013-08-22 MED ORDER — NALBUPHINE HCL 10 MG/ML IJ SOLN
5.0000 mg | INTRAMUSCULAR | Status: DC | PRN
  Administered 2013-08-22: 10 mg via SUBCUTANEOUS
  Filled 2013-08-22: qty 1

## 2013-08-22 SURGICAL SUPPLY — 44 items
ADH SKN CLS APL DERMABOND .7 (GAUZE/BANDAGES/DRESSINGS)
CANISTER WOUND CARE 500ML ATS (WOUND CARE) IMPLANT
CLAMP CORD UMBIL (MISCELLANEOUS) IMPLANT
CLOTH BEACON ORANGE TIMEOUT ST (SAFETY) ×3 IMPLANT
CONTAINER PREFILL 10% NBF 15ML (MISCELLANEOUS) ×2 IMPLANT
DERMABOND ADVANCED (GAUZE/BANDAGES/DRESSINGS)
DERMABOND ADVANCED .7 DNX12 (GAUZE/BANDAGES/DRESSINGS) ×1 IMPLANT
DRAPE LG THREE QUARTER DISP (DRAPES) IMPLANT
DRESSING DISP NPWT PICO 4X12 (MISCELLANEOUS) ×2 IMPLANT
DRSG OPSITE POSTOP 4X10 (GAUZE/BANDAGES/DRESSINGS) ×3 IMPLANT
DRSG VAC ATS LRG SENSATRAC (GAUZE/BANDAGES/DRESSINGS) IMPLANT
DRSG VAC ATS MED SENSATRAC (GAUZE/BANDAGES/DRESSINGS) IMPLANT
DRSG VAC ATS SM SENSATRAC (GAUZE/BANDAGES/DRESSINGS) IMPLANT
DURAPREP 26ML APPLICATOR (WOUND CARE) ×3 IMPLANT
ELECT REM PT RETURN 9FT ADLT (ELECTROSURGICAL) ×3
ELECTRODE REM PT RTRN 9FT ADLT (ELECTROSURGICAL) ×1 IMPLANT
EXTRACTOR VACUUM M CUP 4 TUBE (SUCTIONS) ×1 IMPLANT
EXTRACTOR VACUUM M CUP 4' TUBE (SUCTIONS) ×1
GLOVE BIO SURGEON STRL SZ8 (GLOVE) ×6 IMPLANT
GOWN STRL REUS W/TWL LRG LVL3 (GOWN DISPOSABLE) ×6 IMPLANT
KIT ABG SYR 3ML LUER SLIP (SYRINGE) IMPLANT
NDL HYPO 25X5/8 SAFETYGLIDE (NEEDLE) ×1 IMPLANT
NEEDLE HYPO 25X5/8 SAFETYGLIDE (NEEDLE) ×3 IMPLANT
NS IRRIG 1000ML POUR BTL (IV SOLUTION) ×3 IMPLANT
PACK C SECTION WH (CUSTOM PROCEDURE TRAY) ×3 IMPLANT
PAD OB MATERNITY 4.3X12.25 (PERSONAL CARE ITEMS) ×3 IMPLANT
RTRCTR C-SECT PINK 25CM LRG (MISCELLANEOUS) ×3 IMPLANT
STAPLER VISISTAT 35W (STAPLE) ×2 IMPLANT
SUT GUT PLAIN 0 CT-3 TAN 27 (SUTURE) IMPLANT
SUT MNCRL 0 VIOLET CTX 36 (SUTURE) ×3 IMPLANT
SUT MNCRL AB 4-0 PS2 18 (SUTURE) ×2 IMPLANT
SUT MON AB 2-0 CT1 27 (SUTURE) ×3 IMPLANT
SUT MON AB 3-0 SH 27 (SUTURE)
SUT MON AB 3-0 SH27 (SUTURE) IMPLANT
SUT MONOCRYL 0 CTX 36 (SUTURE) ×6
SUT PDS AB 0 CTX 60 (SUTURE) ×2 IMPLANT
SUT PLAIN 2 0 XLH (SUTURE) ×2 IMPLANT
SUT VIC AB 0 CTX 36 (SUTURE)
SUT VIC AB 0 CTX36XBRD ANBCTRL (SUTURE) IMPLANT
SUT VIC AB 2-0 CT1 27 (SUTURE) ×6
SUT VIC AB 2-0 CT1 TAPERPNT 27 (SUTURE) IMPLANT
TOWEL OR 17X24 6PK STRL BLUE (TOWEL DISPOSABLE) ×3 IMPLANT
TRAY FOLEY CATH 14FR (SET/KITS/TRAYS/PACK) ×3 IMPLANT
WATER STERILE IRR 1000ML POUR (IV SOLUTION) ×1 IMPLANT

## 2013-08-22 NOTE — Consult Note (Addendum)
Neonatology Note:  Attendance at C-section:  I was asked by Dr. Harper to attend this repeat C/S at term. The mother is a G2P1 AB pos, GBS pos with an uncomplicated pregnancy. ROM at delivery, fluid clear. Vacuum-assisted delivery. Infant vigorous with good spontaneous cry and tone. Needed only minimal bulb suctioning. Ap 9/9. Lungs clear to ausc in DR. To CN to care of Pediatrician.  Lucca Greggs C. Rasmus Preusser, MD   

## 2013-08-22 NOTE — Anesthesia Preprocedure Evaluation (Addendum)
Anesthesia Evaluation  Patient identified by MRN, date of birth, ID band Patient awake    Reviewed: Allergy & Precautions, H&P , NPO status , Patient's Chart, lab work & pertinent test results  Airway Mallampati: II TM Distance: >3 FB Neck ROM: Full    Dental no notable dental hx. (+) Teeth Intact   Pulmonary neg pulmonary ROS,  breath sounds clear to auscultation  Pulmonary exam normal       Cardiovascular negative cardio ROS  Rhythm:Regular Rate:Normal     Neuro/Psych negative neurological ROS  negative psych ROS   GI/Hepatic Neg liver ROS, GERD-  Medicated and Controlled,  Endo/Other  diabetesMorbid obesity  Renal/GU negative Renal ROS  negative genitourinary   Musculoskeletal negative musculoskeletal ROS (+)   Abdominal (+) + obese,   Peds  Hematology  (+) anemia ,   Anesthesia Other Findings   Reproductive/Obstetrics (+) Pregnancy                          Anesthesia Physical Anesthesia Plan  ASA: III  Anesthesia Plan: Spinal   Post-op Pain Management:    Induction:   Airway Management Planned: Natural Airway  Additional Equipment:   Intra-op Plan:   Post-operative Plan:   Informed Consent: I have reviewed the patients History and Physical, chart, labs and discussed the procedure including the risks, benefits and alternatives for the proposed anesthesia with the patient or authorized representative who has indicated his/her understanding and acceptance.     Plan Discussed with: Anesthesiologist  Anesthesia Plan Comments:        Anesthesia Quick Evaluation

## 2013-08-22 NOTE — H&P (Signed)
Ashley Nielsen is a 33 y.o. female presenting for repeat C/S. Maternal Medical History:  Reason for admission: 33 yo G2 P1.  EDC 08-27-13.  Presents for repeat C/S.  Fetal activity: Perceived fetal activity is normal.   Last perceived fetal movement was within the past hour.    Prenatal complications: no prenatal complications Prenatal Complications - Diabetes: none.    OB History   Grav Para Term Preterm Abortions TAB SAB Ect Mult Living   2 1 1  0 0 0 0 0 0 1     Past Medical History  Diagnosis Date  . Abnormal Pap smear   . History of tuberculosis 2000    treated  . Tuberculosis     '99  . Gestational diabetes      with last preg only   Past Surgical History  Procedure Laterality Date  . Neck lesion biopsy      had tb in lymph node  . Eye surgery      abcess behind L eye  . Cesarean section  07/14/2011    Procedure: CESAREAN SECTION;  Surgeon: Roseanna RainbowLisa A Jackson-Moore, MD;  Location: [facility];  Service: Gynecology;  Laterality: N/A;   Family History: family history includes Alcohol abuse in her maternal grandmother; Alzheimer's disease in her maternal grandmother; Depression in her mother; Diabetes in her mother; Heart disease in her maternal grandmother; Hyperlipidemia in her mother; Hypertension in her mother; Learning disabilities in her other; Stroke in her maternal grandmother. Social History:  reports that she has never smoked. She has never used smokeless tobacco. She reports that she does not drink alcohol or use illicit drugs.   Prenatal Transfer Tool  Maternal Diabetes: No Genetic Screening: Normal Maternal Ultrasounds/Referrals: Normal Fetal Ultrasounds or other Referrals:  None Maternal Substance Abuse:  No Significant Maternal Medications:  None Significant Maternal Lab Results:  None Other Comments:  None  Review of Systems  All other systems reviewed and are negative.     Blood pressure 156/97, pulse 88, temperature 98.2 F (36.8 C), temperature source  Oral, resp. rate 18, last menstrual period 11/20/2012, SpO2 99.00%. Maternal Exam:  Abdomen: Patient reports no abdominal tenderness.   Physical Exam  Nursing note and vitals reviewed. Constitutional: She is oriented to person, place, and time. She appears well-developed and well-nourished.  HENT:  Head: Normocephalic and atraumatic.  Eyes: Conjunctivae are normal. Pupils are equal, round, and reactive to light.  Neck: Normal range of motion. Neck supple.  Cardiovascular: Normal rate and regular rhythm.   Respiratory: Effort normal and breath sounds normal.  GI: Soft.  Musculoskeletal: Normal range of motion.  Neurological: She is alert and oriented to person, place, and time.  Skin: Skin is warm and dry. No erythema.  Psychiatric: She has a normal mood and affect. Her behavior is normal. Judgment and thought content normal.    Prenatal labs: ABO, Rh: --/--/AB POS (04/07 1326) Antibody: NEG (04/07 1326) Rubella: 1.04 (03/02 1522) RPR: NON REAC (04/07 1326)  HBsAg: NEGATIVE (03/02 1522)  HIV: NON REACTIVE (03/02 1522)  GBS: POSITIVE (03/02 1523)   Assessment/Plan: 39 weeks.  Elective repeat C/S.   [name] Jonda Alanis 08/22/2013, 11:23 AM

## 2013-08-22 NOTE — Anesthesia Postprocedure Evaluation (Signed)
  Anesthesia Post-op Note  Patient: Ashley Nielsen  Procedure(s) Performed: Procedure(s): CESAREAN SECTION (N/A)  Patient Location: PACU  Anesthesia Type:Spinal  Level of Consciousness: awake, alert  and oriented  Airway and Oxygen Therapy: Patient Spontanous Breathing  Post-op Pain: none  Post-op Assessment: Post-op Vital signs reviewed, Patient's Cardiovascular Status Stable, Respiratory Function Stable, Patent Airway, No signs of Nausea or vomiting, Pain level controlled, No headache and No backache  Post-op Vital Signs: Reviewed and stable  Last Vitals:  Filed Vitals:   08/22/13 1330  BP: 148/108  Pulse: 72  Temp:   Resp: 21    Complications: No apparent anesthesia complications

## 2013-08-22 NOTE — Anesthesia Procedure Notes (Signed)
Spinal  Patient location during procedure: OB Start time: 08/22/2013 11:30 AM End time: 08/22/2013 11:37 AM Staffing Anesthesiologist: Reiley Bertagnolli, CHRIS Performed by: anesthesiologist  Preanesthetic Checklist Completed: patient identified, surgical consent, pre-op evaluation, timeout performed, IV checked, risks and benefits discussed and monitors and equipment checked Spinal Block Patient position: sitting Prep: site prepped and draped and DuraPrep Patient monitoring: heart rate, cardiac monitor, continuous pulse ox and blood pressure Approach: midline Location: L3-4 Injection technique: single-shot Needle Needle type: Pencan  Needle gauge: 24 G Needle length: 10 cm Assessment Sensory level: T3

## 2013-08-22 NOTE — Transfer of Care (Signed)
Immediate Anesthesia Transfer of Care Note  Patient: Ashley Nielsen  Procedure(s) Performed: Procedure(s): CESAREAN SECTION (N/A)  Patient Location: PACU  Anesthesia Type:Spinal  Level of Consciousness: awake, alert , oriented and patient cooperative  Airway & Oxygen Therapy: Patient Spontanous Breathing  Post-op Assessment: Report given to PACU RN and Post -op Vital signs reviewed and stable  Post vital signs: Reviewed and stable  Complications: No apparent anesthesia complications

## 2013-08-22 NOTE — Progress Notes (Signed)
Notified Dr. Malen GauzeFoster that patient BP was in the 140/80 and he was ok with patient being discharged from PACU.  Ashley Julyoletta Shayne Diguglielmo, RN

## 2013-08-22 NOTE — Op Note (Signed)
Cesarean Section Procedure Note   Ashley Nielsen   08/22/2013  Indications: Scheduled Proceedure/Maternal Request   Pre-operative Diagnosis: PREVIOUS Cesarean section.   Post-operative Diagnosis: Same   Surgeon: [name] Akera Snowberger  Assistants: Kathreen CosierBernard A. Marshall  Anesthesia: spinal  Procedure Details:  The patient was seen in the Holding Room. The risks, benefits, complications, treatment options, and expected outcomes were discussed with the patient. The patient concurred with the proposed plan, giving informed consent. The patient was identified as Ashley Nielsen and the procedure verified as C-Section Delivery. A Time Out was held and the above information confirmed.  After induction of anesthesia, the patient was draped and prepped in the usual sterile manner. A transverse incision was made and carried down through the subcutaneous tissue to the fascia. The fascial incision was made and extended transversely. The fascia was separated from the underlying rectus tissue superiorly and inferiorly. The peritoneum was identified and entered. The peritoneal incision was extended longitudinally. The utero-vesical peritoneal reflection was incised transversely and the bladder flap was bluntly freed from the lower uterine segment. A low transverse uterine incision was made. Delivered from cephalic presentation was a 3470 gram living newborn female infant(s). APGAR (1 MIN): 9   APGAR (5 MINS): 9   APGAR (10 MINS):    A cord ph was not sent. The umbilical cord was clamped and cut cord. A sample was obtained for evaluation. The placenta was removed Intact and appeared normal.  The uterine incision was closed with running locked sutures of 1-0 Monocryl. A second imbricating layer of the same suture was placed.  Hemostasis was observed. The paracolic gutters were irrigated. The parieto peritoneum was closed in a running fashion with 2-0 Vicryl.  The fascia was then reapproximated with running sutures of 0  Vicryl.  The skin was closed with staples.  Instrument, sponge, and needle counts were correct prior the abdominal closure and were correct at the conclusion of the case.    Findings: Normal uterus, ovaries and tubes.   Estimated Blood Loss: * No blood loss amount entered *   Total IV Fluids: 2800ml   Urine Output: 150CC OF clear urine  Specimens: @ORSPECIMEN @   Complications: no complications  Disposition: PACU - hemodynamically stable.  Maternal Condition: stable   Baby condition / location:  Couplet care / Skin to Skin    Signed: Surgeon(s): [name] Ailea Rhatigan, MD Kathreen CosierBernard A Marshall, MD

## 2013-08-23 ENCOUNTER — Encounter (HOSPITAL_COMMUNITY): Payer: Self-pay | Admitting: Obstetrics

## 2013-08-23 DIAGNOSIS — Z98891 History of uterine scar from previous surgery: Secondary | ICD-10-CM

## 2013-08-23 LAB — CBC
HEMATOCRIT: 20.1 % — AB (ref 36.0–46.0)
Hemoglobin: 6.2 g/dL — CL (ref 12.0–15.0)
MCH: 22.9 pg — ABNORMAL LOW (ref 26.0–34.0)
MCHC: 30.8 g/dL (ref 30.0–36.0)
MCV: 74.2 fL — ABNORMAL LOW (ref 78.0–100.0)
PLATELETS: 303 10*3/uL (ref 150–400)
RBC: 2.71 MIL/uL — ABNORMAL LOW (ref 3.87–5.11)
RDW: 16.5 % — ABNORMAL HIGH (ref 11.5–15.5)
WBC: 10.2 10*3/uL (ref 4.0–10.5)

## 2013-08-23 MED ORDER — ONDANSETRON HCL 4 MG PO TABS: 4.0000 mg | ORAL_TABLET | ORAL | Status: DC | PRN

## 2013-08-23 MED ORDER — LACTATED RINGERS IV SOLN: INTRAVENOUS | Status: DC

## 2013-08-23 MED ORDER — ZOLPIDEM TARTRATE 5 MG PO TABS: 5.0000 mg | ORAL_TABLET | Freq: Every evening | ORAL | Status: DC | PRN

## 2013-08-23 MED ORDER — SENNOSIDES-DOCUSATE SODIUM 8.6-50 MG PO TABS
2.0000 | ORAL_TABLET | ORAL | Status: DC
  Administered 2013-08-23 – 2013-08-25 (×2): 2 via ORAL
  Filled 2013-08-23: qty 2

## 2013-08-23 MED ORDER — IBUPROFEN 600 MG PO TABS
600.0000 mg | ORAL_TABLET | Freq: Four times a day (QID) | ORAL | Status: DC
  Administered 2013-08-23 – 2013-08-25 (×10): 600 mg via ORAL
  Filled 2013-08-23 (×10): qty 1

## 2013-08-23 MED ORDER — SIMETHICONE 80 MG PO CHEW
80.0000 mg | CHEWABLE_TABLET | ORAL | Status: DC | PRN
  Administered 2013-08-23: 80 mg via ORAL

## 2013-08-23 MED ORDER — OXYTOCIN 40 UNITS IN LACTATED RINGERS INFUSION - SIMPLE MED: 62.5000 mL/h | INTRAVENOUS | Status: AC

## 2013-08-23 MED ORDER — OXYCODONE-ACETAMINOPHEN 5-325 MG PO TABS
1.0000 | ORAL_TABLET | ORAL | Status: DC | PRN
  Administered 2013-08-23 (×4): 1 via ORAL
  Administered 2013-08-24: 2 via ORAL
  Administered 2013-08-24 (×2): 1 via ORAL
  Administered 2013-08-24 – 2013-08-25 (×3): 2 via ORAL
  Filled 2013-08-23 (×2): qty 1
  Filled 2013-08-23 (×2): qty 2
  Filled 2013-08-23: qty 1
  Filled 2013-08-23: qty 2
  Filled 2013-08-23: qty 1
  Filled 2013-08-23: qty 2
  Filled 2013-08-23 (×3): qty 1

## 2013-08-23 MED ORDER — WITCH HAZEL-GLYCERIN EX PADS: 1.0000 "application " | MEDICATED_PAD | CUTANEOUS | Status: DC | PRN

## 2013-08-23 MED ORDER — LANOLIN HYDROUS EX OINT: 1.0000 "application " | TOPICAL_OINTMENT | CUTANEOUS | Status: DC | PRN

## 2013-08-23 MED ORDER — MENTHOL 3 MG MT LOZG: 1.0000 | LOZENGE | OROMUCOSAL | Status: DC | PRN

## 2013-08-23 MED ORDER — PRENATAL MULTIVITAMIN CH
1.0000 | ORAL_TABLET | Freq: Every day | ORAL | Status: DC
  Administered 2013-08-23 – 2013-08-25 (×3): 1 via ORAL
  Filled 2013-08-23 (×3): qty 1

## 2013-08-23 MED ORDER — DIPHENHYDRAMINE HCL 25 MG PO CAPS
25.0000 mg | ORAL_CAPSULE | Freq: Four times a day (QID) | ORAL | Status: DC | PRN
  Administered 2013-08-23: 25 mg via ORAL
  Filled 2013-08-23: qty 1

## 2013-08-23 MED ORDER — SIMETHICONE 80 MG PO CHEW
80.0000 mg | CHEWABLE_TABLET | ORAL | Status: DC
  Administered 2013-08-25: 80 mg via ORAL
  Filled 2013-08-23 (×2): qty 1

## 2013-08-23 MED ORDER — ONDANSETRON HCL 4 MG/2ML IJ SOLN: 4.0000 mg | INTRAMUSCULAR | Status: DC | PRN

## 2013-08-23 MED ORDER — DIBUCAINE 1 % RE OINT: 1.0000 "application " | TOPICAL_OINTMENT | RECTAL | Status: DC | PRN

## 2013-08-23 MED ORDER — MEDROXYPROGESTERONE ACETATE 150 MG/ML IM SUSP: 150.0000 mg | INTRAMUSCULAR | Status: DC | PRN

## 2013-08-23 MED ORDER — TETANUS-DIPHTH-ACELL PERTUSSIS 5-2.5-18.5 LF-MCG/0.5 IM SUSP
0.5000 mL | Freq: Once | INTRAMUSCULAR | Status: AC
  Administered 2013-08-25: 0.5 mL via INTRAMUSCULAR
  Filled 2013-08-23: qty 0.5

## 2013-08-23 MED ORDER — SIMETHICONE 80 MG PO CHEW
80.0000 mg | CHEWABLE_TABLET | Freq: Three times a day (TID) | ORAL | Status: DC
  Administered 2013-08-23 – 2013-08-25 (×7): 80 mg via ORAL
  Filled 2013-08-23 (×6): qty 1

## 2013-08-23 NOTE — Lactation Note (Signed)
This note was copied from the chart of Ashley Aundria Rudlicia Arel. Lactation Consultation Note Follow up visit at 35 hours mom reports several good feeding.  Baby first stoolled at >24 hours and it was a smear.  Baby has only had one void and that was more than 24 hours ago.  Attempted hand expression but no visible colostrum noted.  Mom reports she never got milk with her last pregnancy 2 years ago.  Baby latched well for 5 minutes no swallows noted.  Encouraged mom to start with DEBP with every feeding to stimulate milk supply.  Baby moved to crib and had wet and meconium diaper.  Set up and instructions given on DEBP and [facility], and encouraged to consider supplementation if needed after weight check.   Mom to call for assist as needed.  Patient Name: Ashley Nielsen RUEAV'WToday's Date: 08/23/2013 Reason for consult: Follow-up assessment   Maternal Data    Feeding Feeding Type: Breast Fed Length of feed: 5 min  LATCH Score/Interventions Latch: Grasps breast easily, tongue down, lips flanged, rhythmical sucking.  Audible Swallowing: None Intervention(s): Skin to skin;Hand expression  Type of Nipple: Flat  Comfort (Breast/Nipple): Soft / non-tender     Hold (Positioning): No assistance needed to correctly position infant at breast. Intervention(s): Breastfeeding basics reviewed;Support Pillows;Position options;Skin to skin  LATCH Score: 7  Lactation Tools Discussed/Used Pump Review: Setup, frequency, and cleaning Initiated by:: JS Date initiated:: 08/23/13   Consult Status Consult Status: Follow-up Date: 08/24/13 Follow-up type: In-patient    [name] 08/23/2013, 11:42 PM

## 2013-08-23 NOTE — Progress Notes (Signed)
Subjective: Postpartum Day 1: Cesarean Delivery Patient reports tolerating PO.    Objective: Vital signs in last 24 hours: Temp:  [97.9 F (36.6 C)-99 F (37.2 C)] 98.5 F (36.9 C) (04/09 0817) Pulse Rate:  [68-92] 92 (04/09 0817) Resp:  [13-22] 18 (04/09 0817) BP: (113-158)/(54-108) 137/74 mmHg (04/09 0817) SpO2:  [95 %-100 %] 95 % (04/09 0817) Weight:  [292 lb 9.6 oz (132.722 kg)] 292 lb 9.6 oz (132.722 kg) (04/08 1500)  Physical Exam:  General: alert and no distress Lochia: appropriate Uterine Fundus: firm Incision: healing well DVT Evaluation: No evidence of DVT seen on physical exam.   Recent Labs  08/21/13 1327 08/23/13 0703  HGB 9.5* 6.2*  HCT 30.2* 20.1*    Assessment/Plan: Status post Cesarean section. Doing well postoperatively.  Anemia.  Clinically stable.  Iron Rx. Continue current care.  Ashley Nielsen Ashley Nielsen 08/23/2013, 9:31 AM

## 2013-08-23 NOTE — Anesthesia Postprocedure Evaluation (Signed)
  Anesthesia Post-op Note  Patient: Ashley Nielsen  Procedure(s) Performed: Procedure(s): CESAREAN SECTION (N/A)  Patient Location: Mother/Baby  Anesthesia Type:Spinal  Level of Consciousness: awake, alert , oriented and patient cooperative  Airway and Oxygen Therapy: Patient Spontanous Breathing  Post-op Pain: mild  Post-op Assessment: Patient's Cardiovascular Status Stable, Respiratory Function Stable, Patent Airway, No signs of Nausea or vomiting, Adequate PO intake and Pain level controlled  Post-op Vital Signs: Reviewed and stable  Last Vitals:  Filed Vitals:   08/23/13 0408  BP: 118/65  Pulse: 86  Temp: 37.2 C  Resp: 20    Complications: No apparent anesthesia complications

## 2013-08-23 NOTE — Progress Notes (Signed)
CSW referral received to assess reason for LPNC @ 32 weeks. Pt states she started to PNC at 3-4 weeks in [place]. Prenatal records in the chart. CSW will monitor drug screen results & make a referral if needed.      

## 2013-08-23 NOTE — Addendum Note (Signed)
Addendum created 08/23/13 0755 by Collier FlowersElizabeth J Amadea Keagy, CRNA   Modules edited: Charges VN, Notes Section   Notes Section:  File: [id number]

## 2013-08-24 MED ORDER — POLYSACCHARIDE IRON COMPLEX 150 MG PO CAPS
150.0000 mg | ORAL_CAPSULE | Freq: Every day | ORAL | Status: DC
  Administered 2013-08-24 – 2013-08-25 (×2): 150 mg via ORAL
  Filled 2013-08-24 (×2): qty 1

## 2013-08-24 NOTE — Lactation Note (Signed)
This note was copied from the chart of Ashley Aundria Rudlicia Hing. Lactation Consultation Note Poor output per RN, mom states she is BF for 60 min. But she isn't getting anything. Stated she never had enough milk w/her 1st. Baby, BF 2-3 wks. DEBP at bedside, pumping w/o colostrum noted. Hand expression w/o any colostrum. Wants to BF and stimulate breast and give formula. SNS set up and formula feeding guide sheet given for age appropriate feeding. Encouraged football hold for deeper latch. Patient Name: Ashley Nielsen ZOXWR'UToday's Date: 08/24/2013 Reason for consult: Follow-up assessment   Maternal Data    Feeding Feeding Type: Breast Fed Length of feed: 20 min  LATCH Score/Interventions Latch: Repeated attempts needed to sustain latch, nipple held in mouth throughout feeding, stimulation needed to elicit sucking reflex. Intervention(s): Adjust position;Assist with latch;Breast massage;Breast compression  Audible Swallowing: None Intervention(s): Skin to skin  Type of Nipple: Flat (flat w/compression/short shaft) Intervention(s): Double electric pump  Comfort (Breast/Nipple): Soft / non-tender     Hold (Positioning): Assistance needed to correctly position infant at breast and maintain latch. Intervention(s): Breastfeeding basics reviewed;Support Pillows;Position options;Skin to skin  LATCH Score: 5  Lactation Tools Discussed/Used Tools: Pump;Supplemental Nutrition System Breast pump type: Double-Electric Breast Pump Pump Review: Setup, frequency, and cleaning Initiated by:: JS Date initiated:: 08/23/13   Consult Status Consult Status: Follow-up Date: 08/24/13 Follow-up type: In-patient    Ashley Nielsen 08/24/2013, 2:48 AM

## 2013-08-24 NOTE — Progress Notes (Signed)
Post Partum Day 2 Subjective: no complaints, up ad lib, voiding, tolerating PO and + flatus Patient reports she does not have dizziness w/ ambulation. Reports fatigue after ambulating for awhile.  Objective: Blood pressure 139/77, pulse 101, temperature 98.9 F (37.2 C), temperature source Oral, resp. rate 18, weight 292 lb 9.6 oz (132.722 kg), last menstrual period 11/20/2012, SpO2 97.00%, unknown if currently breastfeeding.  Physical Exam:  General: alert and cooperative Lochia: appropriate Uterine Fundus: firm Incision: healing well, no significant drainage, dressing intact DVT Evaluation: No evidence of DVT seen on physical exam.   Recent Labs  08/21/13 1327 08/23/13 0703  HGB 9.5* 6.2*  HCT 30.2* 20.1*    Assessment/Plan: Plan for discharge tomorrow and Breastfeeding Reviewed PP education   LOS: 2 days   Ashley Nielsen Ashley Nielsen Ashley Nielsen 08/24/2013, 8:41 AM

## 2013-08-25 MED ORDER — OXYCODONE-ACETAMINOPHEN 5-325 MG PO TABS: 1.0000 | ORAL_TABLET | ORAL | Status: DC | PRN

## 2013-08-25 MED ORDER — FUSION PLUS PO CAPS: 1.0000 | ORAL_CAPSULE | Freq: Every day | ORAL | Status: DC

## 2013-08-25 MED ORDER — IBUPROFEN 600 MG PO TABS: 600.0000 mg | ORAL_TABLET | Freq: Four times a day (QID) | ORAL | Status: DC | PRN

## 2013-08-25 NOTE — Progress Notes (Signed)
Subjective: Postpartum Day 3: Cesarean Delivery Patient reports tolerating PO, + flatus, + BM and no problems voiding.    Objective: Vital signs in last 24 hours: Temp:  [98.3 F (36.8 C)-98.6 F (37 C)] 98.6 F (37 C) (04/11 0612) Pulse Rate:  [83-96] 93 (04/11 0612) Resp:  [18-20] 18 (04/11 0612) BP: (133-156)/(76-93) 140/88 mmHg (04/11 0615) SpO2:  [93 %] 93 % (04/11 0612)  Physical Exam:  General: alert and no distress Lochia: appropriate Uterine Fundus: firm Incision: healing well DVT Evaluation: No evidence of DVT seen on physical exam.   Recent Labs  08/23/13 0703  HGB 6.2*  HCT 20.1*    Assessment/Plan: Status post Cesarean section. Doing well postoperatively.  Discharge home with standard precautions and return to clinic in 2 weeks.  [name] Tomiko Schoon 08/25/2013, 6:29 AM

## 2013-08-25 NOTE — Lactation Note (Signed)
This note was copied from the chart of Ashley Aundria Rudlicia Scheib. Lactation Consultation Note Mother placed baby in football hold easily on right breast, sucks and swallows observed. Mother's breasts are filling, reviewed engorgement care and supply and demand. Mother states she has trouble latching the baby on the left breast. Mother's nipple has division and not round shape at base. Assisted mother with breast compression to try and sustain latch on right but baby unable. Introduced #20 NS and baby latched easily on right breast, breastmilk seen in NS.   Encouraged mother to continue to try without the NS on the right side at home. Reviewed NS use, provided an extra #20 and #24 in case her nipple gets sore. Reviewed hand pump use and pp 20-24 of Baby & Me. Patient Name: Ashley Nielsen RUEAV'WToday's Date: 08/25/2013 Reason for consult: Follow-up assessment   Maternal Data    Feeding Feeding Type: Breast Fed Length of feed: 1 min  LATCH Score/Interventions Latch: Grasps breast easily, tongue down, lips flanged, rhythmical sucking. Intervention(s): Adjust position;Assist with latch;Breast massage;Breast compression  Audible Swallowing: Spontaneous and intermittent Intervention(s): Hand expression;Skin to skin Intervention(s): Alternate breast massage  Type of Nipple: Everted at rest and after stimulation  Comfort (Breast/Nipple): Filling, red/small blisters or bruises, mild/mod discomfort  Problem noted: Mild/Moderate discomfort;Cracked, bleeding, blisters, bruises Interventions (Mild/moderate discomfort): Breast shields  Hold (Positioning): No assistance needed to correctly position infant at breast.  LATCH Score: 9  Lactation Tools Discussed/Used Tools: Nipple Dorris CarnesShields;Pump Nipple shield size: 20;24 (NS left breast only)   Consult Status Consult Status: Complete    Dulce SellarRuth Boschen Roben Tatsch 08/25/2013, 12:20 PM

## 2013-08-25 NOTE — Discharge Summary (Signed)
Obstetric Discharge Summary Reason for Admission: cesarean section Prenatal Procedures: ultrasound Intrapartum Procedures: cesarean: low cervical, transverse Postpartum Procedures: none Complications-Operative and Postpartum: none Hemoglobin  Date Value Ref Range Status  08/23/2013 6.2* 12.0 - 15.0 g/dL Final     DELTA CHECK NOTED     REPEATED TO VERIFY     CRITICAL RESULT CALLED TO, READ BACK BY AND VERIFIED WITH:     HARGROVE,A. @ 0730 ON 08/23/2013 BY BOVELL,A     HCT  Date Value Ref Range Status  08/23/2013 20.1* 36.0 - 46.0 % Final    Physical Exam:  General: alert and no distress Lochia: appropriate Uterine Fundus: firm Incision: healing well DVT Evaluation: No evidence of DVT seen on physical exam.  Discharge Diagnoses: Term Pregnancy-delivered  Discharge Information: Date: 08/25/2013 Activity: pelvic rest Diet: routine Medications: PNV, Ibuprofen, Colace, Iron and Percocet Condition: stable Instructions: refer to practice specific booklet Discharge to: home Follow-up Information   Follow up with [name], MD In 2 weeks.   Specialty:  Obstetrics and Gynecology   Contact information:   [address] [place] [address] [phone]       Newborn Data: Live born female  Birth Weight: 7 lb 10.4 oz (3470 g) APGAR: 9, 9  Home with mother.  Ashley Nielsen A Ashley Nielsen 08/25/2013, 6:34 AM

## 2013-08-25 NOTE — Plan of Care (Signed)
Problem: Discharge Progression Outcomes Goal: Remove staples per MD order Outcome: Adequate for Discharge Staples to be removed outpatient on Wednesday.

## 2013-08-27 ENCOUNTER — Inpatient Hospital Stay (HOSPITAL_COMMUNITY)
Admission: AD | Admit: 2013-08-27 | Discharge: 2013-08-31 | DRG: 776 | Disposition: A | Discharge status: Home / Self Care | Source: Ambulatory Visit | Attending: Obstetrics & Gynecology | Admitting: Obstetrics & Gynecology

## 2013-08-27 ENCOUNTER — Inpatient Hospital Stay (HOSPITAL_COMMUNITY)

## 2013-08-27 ENCOUNTER — Encounter (HOSPITAL_COMMUNITY): Payer: Self-pay

## 2013-08-27 DIAGNOSIS — J810 Acute pulmonary edema: Secondary | ICD-10-CM

## 2013-08-27 DIAGNOSIS — Z833 Family history of diabetes mellitus: Secondary | ICD-10-CM

## 2013-08-27 DIAGNOSIS — Z8611 Personal history of tuberculosis: Secondary | ICD-10-CM

## 2013-08-27 DIAGNOSIS — Z823 Family history of stroke: Secondary | ICD-10-CM

## 2013-08-27 DIAGNOSIS — Z8249 Family history of ischemic heart disease and other diseases of the circulatory system: Secondary | ICD-10-CM

## 2013-08-27 DIAGNOSIS — J811 Chronic pulmonary edema: Secondary | ICD-10-CM | POA: Diagnosis present

## 2013-08-27 DIAGNOSIS — D649 Anemia, unspecified: Secondary | ICD-10-CM | POA: Diagnosis present

## 2013-08-27 DIAGNOSIS — J90 Pleural effusion, not elsewhere classified: Secondary | ICD-10-CM

## 2013-08-27 DIAGNOSIS — O1490 Unspecified pre-eclampsia, unspecified trimester: Secondary | ICD-10-CM

## 2013-08-27 DIAGNOSIS — O1415 Severe pre-eclampsia, complicating the puerperium: Principal | ICD-10-CM | POA: Diagnosis present

## 2013-08-27 DIAGNOSIS — D62 Acute posthemorrhagic anemia: Secondary | ICD-10-CM

## 2013-08-27 DIAGNOSIS — Z98891 History of uterine scar from previous surgery: Secondary | ICD-10-CM

## 2013-08-27 DIAGNOSIS — O9081 Anemia of the puerperium: Secondary | ICD-10-CM | POA: Diagnosis present

## 2013-08-27 DIAGNOSIS — R0602 Shortness of breath: Secondary | ICD-10-CM

## 2013-08-27 DIAGNOSIS — Z8632 Personal history of gestational diabetes: Secondary | ICD-10-CM

## 2013-08-27 LAB — BASIC METABOLIC PANEL
BUN: 9 mg/dL (ref 6–23)
CALCIUM: 8.5 mg/dL (ref 8.4–10.5)
CO2: 25 mEq/L (ref 19–32)
Chloride: 105 mEq/L (ref 96–112)
Creatinine, Ser: 0.78 mg/dL (ref 0.50–1.10)
GFR calc Af Amer: >90 mL/min (ref >90)
GFR calc non Af Amer: >90 mL/min (ref >90)
GLUCOSE: 79 mg/dL (ref 70–99)
POTASSIUM: 3.8 meq/L (ref 3.7–5.3)
Sodium: 141 mEq/L (ref 137–147)

## 2013-08-27 LAB — COMPREHENSIVE METABOLIC PANEL
ALK PHOS: 80 U/L (ref 39–117)
ALT: 30 U/L (ref 0–35)
AST: 48 U/L — ABNORMAL HIGH (ref 0–37)
Albumin: 2.4 g/dL — ABNORMAL LOW (ref 3.5–5.2)
BUN: 10 mg/dL (ref 6–23)
CHLORIDE: 107 meq/L (ref 96–112)
CO2: 23 meq/L (ref 19–32)
Calcium: 8.2 mg/dL — ABNORMAL LOW (ref 8.4–10.5)
Creatinine, Ser: 0.78 mg/dL (ref 0.50–1.10)
GLUCOSE: 77 mg/dL (ref 70–99)
POTASSIUM: 3.8 meq/L (ref 3.7–5.3)
SODIUM: 142 meq/L (ref 137–147)
Total Bilirubin: 0.4 mg/dL (ref 0.3–1.2)
Total Protein: 6.2 g/dL (ref 6.0–8.3)

## 2013-08-27 LAB — CBC
HCT: 22.1 % — ABNORMAL LOW (ref 36.0–46.0)
HEMOGLOBIN: 6.8 g/dL — AB (ref 12.0–15.0)
MCH: 23.4 pg — ABNORMAL LOW (ref 26.0–34.0)
MCHC: 30.8 g/dL (ref 30.0–36.0)
MCV: 75.9 fL — ABNORMAL LOW (ref 78.0–100.0)
Platelets: 421 10*3/uL — ABNORMAL HIGH (ref 150–400)
RBC: 2.91 MIL/uL — ABNORMAL LOW (ref 3.87–5.11)
RDW: 18.1 % — ABNORMAL HIGH (ref 11.5–15.5)
WBC: 7.5 10*3/uL (ref 4.0–10.5)

## 2013-08-27 LAB — URINALYSIS, ROUTINE W REFLEX MICROSCOPIC
BILIRUBIN URINE: NEGATIVE
Glucose, UA: NEGATIVE mg/dL
Ketones, ur: NEGATIVE mg/dL
Leukocytes, UA: NEGATIVE
Nitrite: NEGATIVE
PROTEIN: NEGATIVE mg/dL
Specific Gravity, Urine: <1.005 — ABNORMAL LOW (ref 1.005–1.030)
UROBILINOGEN UA: 0.2 mg/dL (ref 0.0–1.0)
pH: 6 (ref 5.0–8.0)

## 2013-08-27 LAB — URINE MICROSCOPIC-ADD ON

## 2013-08-27 LAB — D-DIMER, QUANTITATIVE: D-Dimer, Quant: 2.97 ug/mL-FEU — ABNORMAL HIGH (ref 0.00–0.48)

## 2013-08-27 LAB — URIC ACID: Uric Acid, Serum: 8 mg/dL — ABNORMAL HIGH (ref 2.4–7.0)

## 2013-08-27 LAB — LACTATE DEHYDROGENASE: LDH: 323 U/L — ABNORMAL HIGH (ref 94–250)

## 2013-08-27 LAB — MRSA PCR SCREENING: MRSA by PCR: NEGATIVE

## 2013-08-27 MED ORDER — FUROSEMIDE 10 MG/ML IJ SOLN
20.0000 mg | Freq: Two times a day (BID) | INTRAMUSCULAR | Status: DC
  Administered 2013-08-27: 20 mg via INTRAVENOUS
  Filled 2013-08-27: qty 2

## 2013-08-27 MED ORDER — MAGNESIUM SULFATE BOLUS VIA INFUSION
4.0000 g | Freq: Once | INTRAVENOUS | Status: AC
  Administered 2013-08-27: 4 g via INTRAVENOUS
  Filled 2013-08-27: qty 500

## 2013-08-27 MED ORDER — MAGNESIUM SULFATE 40 G IN LACTATED RINGERS - SIMPLE
2.0000 g/h | INTRAVENOUS | Status: DC
  Administered 2013-08-27 – 2013-08-29 (×3): 2 g/h via INTRAVENOUS
  Filled 2013-08-27 (×3): qty 500

## 2013-08-27 MED ORDER — FUROSEMIDE 10 MG/ML IJ SOLN
10.0000 mg | Freq: Once | INTRAMUSCULAR | Status: AC
  Administered 2013-08-27: 10 mg via INTRAVENOUS
  Filled 2013-08-27: qty 1

## 2013-08-27 MED ORDER — IOHEXOL 350 MG/ML SOLN
100.0000 mL | Freq: Once | INTRAVENOUS | Status: AC | PRN
  Administered 2013-08-27: 100 mL via INTRAVENOUS

## 2013-08-27 MED ORDER — LABETALOL HCL 200 MG PO TABS
200.0000 mg | ORAL_TABLET | Freq: Three times a day (TID) | ORAL | Status: DC
  Administered 2013-08-27 – 2013-08-28 (×3): 200 mg via ORAL
  Filled 2013-08-27 (×3): qty 1

## 2013-08-27 MED ORDER — LACTATED RINGERS IV SOLN
INTRAVENOUS | Status: DC
  Administered 2013-08-27: 14:00:00 via INTRAVENOUS

## 2013-08-27 MED ORDER — OXYCODONE-ACETAMINOPHEN 5-325 MG PO TABS
1.0000 | ORAL_TABLET | Freq: Four times a day (QID) | ORAL | Status: DC | PRN
  Administered 2013-08-27 (×2): 1 via ORAL
  Administered 2013-08-28 – 2013-08-29 (×3): 2 via ORAL
  Filled 2013-08-27: qty 1
  Filled 2013-08-27 (×2): qty 2
  Filled 2013-08-27: qty 1
  Filled 2013-08-27: qty 2

## 2013-08-27 NOTE — MAU Provider Note (Signed)
History     CSN: [id number]  Arrival date and time: 08/27/13 0757   First Provider Initiated Contact with Patient 08/27/13 [phone]      Chief Complaint  Patient presents with  . Shortness of Breath   HPI  Ms. Ashley Nielsen is a 33 y.o. female G2P2002; status post repeat cesarean birth 5 days ago by Dr.  Jodi Mourning. Pt was sent home without complications. Around 11:00 pm last night she started experiencing worsening of shortness of breath; she felt like she could not breath at all. " It felt like someone was sitting on my chest". Currently, she feels she cannot breath when she is laying down, pt feels ok while she is sitting up. Denies chest pain; it is not painful, it just feels like pressure. Pt feels she has increased swelling in her feet/ ankles and a cough that feels wet.   OB History   Grav Para Term Preterm Abortions TAB SAB Ect Mult Living   '2 2 2 '$ 0 0 0 0 0 0 2      Past Medical History  Diagnosis Date  . Abnormal Pap smear   . History of tuberculosis 2000    treated  . Tuberculosis     '99  . Gestational diabetes      with last preg only    Past Surgical History  Procedure Laterality Date  . Neck lesion biopsy      had tb in lymph node  . Eye surgery      abcess behind L eye  . Cesarean section  07/14/2011    Procedure: CESAREAN SECTION;  Surgeon: Agnes Lawrence, MD;  Location: [facility];  Service: Gynecology;  Laterality: N/A;  . Cesarean section N/A 08/22/2013    Procedure: CESAREAN SECTION;  Surgeon: Shelly Bombard, MD;  Location: [facility];  Service: Obstetrics;  Laterality: N/A;    Family History  Problem Relation Age of Onset  . Diabetes Mother   . Hypertension Mother   . Depression Mother   . Hyperlipidemia Mother   . Alcohol abuse Maternal Grandmother   . Alzheimer's disease Maternal Grandmother   . Stroke Maternal Grandmother   . Heart disease Maternal Grandmother   . Learning disabilities Other     autism    History  Substance Use Topics  .  Smoking status: Never Smoker   . Smokeless tobacco: Never Used  . Alcohol Use: No    Allergies: No Known Allergies  Prescriptions prior to admission  Medication Sig Dispense Refill  . ibuprofen (ADVIL,MOTRIN) 600 MG tablet Take 1 tablet (600 mg total) by mouth every 6 (six) hours as needed.  30 tablet  5  . Iron-FA-B Cmp-C-Biot-Probiotic (FUSION PLUS) CAPS Take 1 capsule by mouth daily before breakfast.  30 capsule  5  . Iron-FA-B Cmp-C-Biot-Probiotic (FUSION PLUS) CAPS Take 1 capsule by mouth daily before breakfast.  30 capsule  5  . oxyCODONE-acetaminophen (PERCOCET/ROXICET) 5-325 MG per tablet Take 1-2 tablets by mouth every 4 (four) hours as needed for severe pain (moderate - severe pain).  40 tablet  0  . Prenatal Vit-Fe Fumarate-FA (PRENATAL MULTIVITAMIN) TABS Take 1 tablet by mouth daily at 12 noon.        Results for orders placed during the hospital encounter of 08/27/13 (from the past 48 hour(s))  CBC     Status: Abnormal   Collection Time    08/27/13  8:30 AM      Result Value Ref Range   WBC 7.5  4.0 - 10.5 K/uL   RBC 2.91 (*) 3.87 - 5.11 MIL/uL   Hemoglobin 6.8 (*) 12.0 - 15.0 g/dL   Comment: REPEATED TO VERIFY     CRITICAL RESULT CALLED TO, READ BACK BY AND VERIFIED WITH:     CARRERA,S AT 0915 ON 08/27/13 BY HAGGINSC   HCT 22.1 (*) 36.0 - 46.0 %   MCV 75.9 (*) 78.0 - 100.0 fL   MCH 23.4 (*) 26.0 - 34.0 pg   MCHC 30.8  30.0 - 36.0 g/dL   RDW 95.8 (*) 48.7 - 42.5 %   Platelets 421 (*) 150 - 400 K/uL  COMPREHENSIVE METABOLIC PANEL     Status: Abnormal   Collection Time    08/27/13  8:30 AM      Result Value Ref Range   Sodium 142  137 - 147 mEq/L   Potassium 3.8  3.7 - 5.3 mEq/L   Chloride 107  96 - 112 mEq/L   CO2 23  19 - 32 mEq/L   Glucose, Bld 77  70 - 99 mg/dL   BUN 10  6 - 23 mg/dL   Creatinine, Ser 4.93  0.50 - 1.10 mg/dL   Calcium 8.2 (*) 8.4 - 10.5 mg/dL   Total Protein 6.2  6.0 - 8.3 g/dL   Albumin 2.4 (*) 3.5 - 5.2 g/dL   AST 48 (*) 0 - 37 U/L   ALT  30  0 - 35 U/L   Alkaline Phosphatase 80  39 - 117 U/L   Total Bilirubin 0.4  0.3 - 1.2 mg/dL   GFR calc non Af Amer >90  >90 mL/min   GFR calc Af Amer >90  >90 mL/min   Comment: (NOTE)     The eGFR has been calculated using the CKD EPI equation.     This calculation has not been validated in all clinical situations.     eGFR's persistently <90 mL/min signify possible Chronic Kidney     Disease.  URIC ACID     Status: Abnormal   Collection Time    08/27/13  8:30 AM      Result Value Ref Range   Uric Acid, Serum 8.0 (*) 2.4 - 7.0 mg/dL  LACTATE DEHYDROGENASE     Status: Abnormal   Collection Time    08/27/13  8:30 AM      Result Value Ref Range   LDH 323 (*) 94 - 250 U/L  D-DIMER, QUANTITATIVE     Status: Abnormal   Collection Time    08/27/13  8:30 AM      Result Value Ref Range   D-Dimer, Quant 2.97 (*) 0.00 - 0.48 ug/mL-FEU   Comment:            AT THE INHOUSE ESTABLISHED CUTOFF     VALUE OF 0.48 ug/mL FEU,     THIS ASSAY HAS BEEN DOCUMENTED     IN THE LITERATURE TO HAVE     A SENSITIVITY AND NEGATIVE     PREDICTIVE VALUE OF AT LEAST     98 TO 99%.  THE TEST RESULT     SHOULD BE CORRELATED WITH     AN ASSESSMENT OF THE CLINICAL     PROBABILITY OF DVT / VTE.   Dg Chest 2 View  08/27/2013   CLINICAL DATA:  Postop, chest pain  EXAM: CHEST  2 VIEW  COMPARISON:  07/21/2011  FINDINGS: Cardiomediastinal silhouette is stable. No acute infiltrate or pleural effusion. No pulmonary edema. Minimal perihilar increased  bronchial markings without focal consolidation.  IMPRESSION: No acute infiltrate or pulmonary edema. Minimal perihilar increased bronchial markings without focal consolidation.   Electronically Signed   By: Lahoma Crocker M.D.   On: 08/27/2013 09:49   Ct Angio Chest W/cm &/or Wo Cm  08/27/2013   CLINICAL DATA:  Shortness of Breath  EXAM: CT ANGIOGRAPHY CHEST WITH CONTRAST  TECHNIQUE: Multidetector CT imaging of the chest was performed using the standard protocol during bolus  administration of intravenous contrast. Multiplanar CT image reconstructions and MIPs were obtained to evaluate the vascular anatomy.  CONTRAST:  168mL OMNIPAQUE IOHEXOL 350 MG/ML SOLN  COMPARISON:  Chest radiograph August 27, 2013  FINDINGS: There is no demonstrable pulmonary embolus. There is no thoracic aortic aneurysm or dissection.  There is a moderate pleural effusion on the right. There is a minimal pleural effusion on the left. There is patchy airspace consolidation in the superior segment right lower lobe as well as in portions of the anterior and posterior segment of the right upper lobe. There is some minimal patchy infiltrate in the anterior segment of the left upper lobe. The left lung is otherwise clear.  There is no appreciable thoracic adenopathy. The pericardium is not thickened. There is thymic tissue, a finding that may be normal for age.  In the visualized upper abdomen, there is fatty change in the liver. Visualized upper abdomen otherwise appears normal. There are no blastic or lytic bone lesions.  Review of the MIP images confirms the above findings.  IMPRESSION: No demonstrable pulmonary embolus. Areas of infiltrate bilaterally, more on the right than on the left. Moderate right effusion with minimal left effusion. Fatty liver.   Electronically Signed   By: Lowella Grip M.D.   On: 08/27/2013 11:24    Review of Systems  Constitutional: Negative for fever and chills.  Eyes: Negative for blurred vision and double vision.  Respiratory: Positive for cough (+ cough feels wet ) and shortness of breath.   Cardiovascular: Positive for leg swelling. Negative for chest pain.  Gastrointestinal: Negative for nausea, vomiting and abdominal pain.  Genitourinary:       No vaginal discharge. + vaginal bleeding; light  No dysuria.   Neurological: Negative for headaches.   Physical Exam   Blood pressure 152/79, pulse 75, temperature 99 F (37.2 C), temperature source Oral, resp. rate 20,  height $Remov'5\' 10"'KbIEkv$  (1.778 m), weight 134.9 kg (297 lb 6.4 oz), SpO2 99.00%, unknown if currently breastfeeding.  Physical Exam  Constitutional: She is oriented to person, place, and time. She appears well-developed and well-nourished. She appears distressed.  HENT:  Head: Normocephalic.  Eyes: Pupils are equal, round, and reactive to light.  Neck: Neck supple.  Cardiovascular: Normal rate and normal heart sounds.   Respiratory: She is in respiratory distress. She has decreased breath sounds in the right upper field, the right middle field and the right lower field. She has no wheezes. She has no rhonchi. She has no rales.  Signs of respiratory distress; signs of nares flaring   GI: Soft. She exhibits no distension. There is no tenderness.  Musculoskeletal: Normal range of motion.       Right ankle: She exhibits swelling.       Left ankle: She exhibits swelling.  Neurological: She is alert and oriented to person, place, and time. She has normal strength. GCS eye subscore is 4. GCS verbal subscore is 5. GCS motor subscore is 6.  Skin: Skin is warm.  Psychiatric: Her behavior is  normal.    MAU Course  Procedures None  MDM PIH labs: CBC, CMET, uric acid, LDH, UA D-Dimer  Chest Xray 2 view Serial BP readings  Consulted with Dr. Delsa Sale who would like the patient to have a spiral CT. I discussed my concern regarding her labs and my suspicion for PE vs. Preeclampsia.  1130: left message with Dr. Delsa Sale to call me back regarding CT results.  1140: Dr. Delsa Sale returned call; orders to RN to admit patient   Assessment and Plan   Assessment:  Pleural effusion Bilateral infiltrates  Preeclampsia; post partum   Plan:  Admit per Dr. Delsa Sale; pt informed   Darrelyn Hillock Rasch 08/27/2013, 2:18 PM

## 2013-08-27 NOTE — H&P (Addendum)
Ashley Nielsen is an 33 y.o. female.   Chief Complaint: Shortness of breath HPI: The 4 days s/p an elective repeat cesarean delivery.  She shortness of breath, orthopnea for a day prior to presentation.  She denies fever, cough.  The pregnancy and immediate postpartum course are remarkable for iron deficiency anemia exacerbated by the blood loss from the cesarean delivery.  Past Medical History  Diagnosis Date  . Abnormal Pap smear   . History of tuberculosis 2000    treated  . Tuberculosis     '99  . Gestational diabetes      with last preg only    Past Surgical History  Procedure Laterality Date  . Neck lesion biopsy      had tb in lymph node  . Eye surgery      abcess behind L eye  . Cesarean section  07/14/2011    Procedure: CESAREAN SECTION;  Surgeon: Lisa A Jackson-Moore, MD;  Location: [facility];  Service: Gynecology;  Laterality: N/A;  . Cesarean section N/A 08/22/2013    Procedure: CESAREAN SECTION;  Surgeon: Charles A Harper, MD;  Location: [facility];  Service: Obstetrics;  Laterality: N/A;    Family History  Problem Relation Age of Onset  . Diabetes Mother   . Hypertension Mother   . Depression Mother   . Hyperlipidemia Mother   . Alcohol abuse Maternal Grandmother   . Alzheimer's disease Maternal Grandmother   . Stroke Maternal Grandmother   . Heart disease Maternal Grandmother   . Learning disabilities Other     autism   Social History:  reports that she has never smoked. She has never used smokeless tobacco. She reports that she does not drink alcohol or use illicit drugs.  Allergies: No Known Allergies  Medications Prior to Admission  Medication Sig Dispense Refill  . docusate sodium (DULCOLAX) 100 MG capsule Take 100 mg by mouth once.      . ibuprofen (ADVIL,MOTRIN) 600 MG tablet Take 1 tablet (600 mg total) by mouth every 6 (six) hours as needed.  30 tablet  5  . oxyCODONE-acetaminophen (PERCOCET/ROXICET) 5-325 MG per tablet Take 1-2 tablets by mouth every 4  (four) hours as needed for severe pain (moderate - severe pain).  40 tablet  0  . Prenatal Vit-Fe Fumarate-FA (PRENATAL MULTIVITAMIN) TABS Take 1 tablet by mouth daily at 12 noon.         Results for orders placed during the hospital encounter of 08/27/13 (from the past 48 hour(s))  CBC     Status: Abnormal   Collection Time    08/27/13  8:30 AM      Result Value Ref Range   WBC 7.5  4.0 - 10.5 K/uL   RBC 2.91 (*) 3.87 - 5.11 MIL/uL   Hemoglobin 6.8 (*) 12.0 - 15.0 g/dL   Comment: REPEATED TO VERIFY     CRITICAL RESULT CALLED TO, READ BACK BY AND VERIFIED WITH:     CARRERA,S AT 0915 ON 08/27/13 BY HAGGINSC   HCT 22.1 (*) 36.0 - 46.0 %   MCV 75.9 (*) 78.0 - 100.0 fL   MCH 23.4 (*) 26.0 - 34.0 pg   MCHC 30.8  30.0 - 36.0 g/dL   RDW 18.1 (*) 11.5 - 15.5 %   Platelets 421 (*) 150 - 400 K/uL  COMPREHENSIVE METABOLIC PANEL     Status: Abnormal   Collection Time    08/27/13  8:30 AM      Result Value Ref Range     Sodium 142  137 - 147 mEq/L   Potassium 3.8  3.7 - 5.3 mEq/L   Chloride 107  96 - 112 mEq/L   CO2 23  19 - 32 mEq/L   Glucose, Bld 77  70 - 99 mg/dL   BUN 10  6 - 23 mg/dL   Creatinine, Ser 0.78  0.50 - 1.10 mg/dL   Calcium 8.2 (*) 8.4 - 10.5 mg/dL   Total Protein 6.2  6.0 - 8.3 g/dL   Albumin 2.4 (*) 3.5 - 5.2 g/dL   AST 48 (*) 0 - 37 U/L   ALT 30  0 - 35 U/L   Alkaline Phosphatase 80  39 - 117 U/L   Total Bilirubin 0.4  0.3 - 1.2 mg/dL   GFR calc non Af Amer >90  >90 mL/min   GFR calc Af Amer >90  >90 mL/min   Comment: (NOTE)     The eGFR has been calculated using the CKD EPI equation.     This calculation has not been validated in all clinical situations.     eGFR's persistently <90 mL/min signify possible Chronic Kidney     Disease.  URIC ACID     Status: Abnormal   Collection Time    08/27/13  8:30 AM      Result Value Ref Range   Uric Acid, Serum 8.0 (*) 2.4 - 7.0 mg/dL  LACTATE DEHYDROGENASE     Status: Abnormal   Collection Time    08/27/13  8:30 AM       Result Value Ref Range   LDH 323 (*) 94 - 250 U/L  D-DIMER, QUANTITATIVE     Status: Abnormal   Collection Time    08/27/13  8:30 AM      Result Value Ref Range   D-Dimer, Quant 2.97 (*) 0.00 - 0.48 ug/mL-FEU   Comment:            AT THE INHOUSE ESTABLISHED CUTOFF     VALUE OF 0.48 ug/mL FEU,     THIS ASSAY HAS BEEN DOCUMENTED     IN THE LITERATURE TO HAVE     A SENSITIVITY AND NEGATIVE     PREDICTIVE VALUE OF AT LEAST     98 TO 99%.  THE TEST RESULT     SHOULD BE CORRELATED WITH     AN ASSESSMENT OF THE CLINICAL     PROBABILITY OF DVT / VTE.   Dg Chest 2 View  08/27/2013   CLINICAL DATA:  Postop, chest pain  EXAM: CHEST  2 VIEW  COMPARISON:  07/21/2011  FINDINGS: Cardiomediastinal silhouette is stable. No acute infiltrate or pleural effusion. No pulmonary edema. Minimal perihilar increased bronchial markings without focal consolidation.  IMPRESSION: No acute infiltrate or pulmonary edema. Minimal perihilar increased bronchial markings without focal consolidation.   Electronically Signed   By: Liviu  Pop M.D.   On: 08/27/2013 09:49   Ct Angio Chest W/cm &/or Wo Cm  08/27/2013   CLINICAL DATA:  Shortness of Breath  EXAM: CT ANGIOGRAPHY CHEST WITH CONTRAST  TECHNIQUE: Multidetector CT imaging of the chest was performed using the standard protocol during bolus administration of intravenous contrast. Multiplanar CT image reconstructions and MIPs were obtained to evaluate the vascular anatomy.  CONTRAST:  100mL OMNIPAQUE IOHEXOL 350 MG/ML SOLN  COMPARISON:  Chest radiograph August 27, 2013  FINDINGS: There is no demonstrable pulmonary embolus. There is no thoracic aortic aneurysm or dissection.  There is a moderate pleural effusion on the right.   There is a minimal pleural effusion on the left. There is patchy airspace consolidation in the superior segment right lower lobe as well as in portions of the anterior and posterior segment of the right upper lobe. There is some minimal patchy infiltrate in  the anterior segment of the left upper lobe. The left lung is otherwise clear.  There is no appreciable thoracic adenopathy. The pericardium is not thickened. There is thymic tissue, a finding that may be normal for age.  In the visualized upper abdomen, there is fatty change in the liver. Visualized upper abdomen otherwise appears normal. There are no blastic or lytic bone lesions.  Review of the MIP images confirms the above findings.  IMPRESSION: No demonstrable pulmonary embolus. Areas of infiltrate bilaterally, more on the right than on the left. Moderate right effusion with minimal left effusion. Fatty liver.   Electronically Signed   By: William  Woodruff M.D.   On: 08/27/2013 11:24    ROS  General:  See HPI Pulmonary: see HPI  Blood pressure 152/79, pulse 75, temperature 99 F (37.2 C), temperature source Oral, resp. rate 20, height 5' 10" (1.778 m), weight 134.9 kg (297 lb 6.4 oz), SpO2 99.00%, unknown if currently breastfeeding. Physical Exam   General: NAD Cor: RRR Chest: diminished breath sounds at the bases Ext: + pedal edema   Assessment/Plan  Preeclampsia with severe features postpartum--likely pulmonary edema.  Less likely infectious etiology of the radiologic findings CAP, reactivation tuberculosis.  Pulmonary status stable at present.  -->Admit-->ICU -->MgSO4, diuresis, fluid restriction, Labetalol -->serial labs -->2D echo -->keep O2 sat >92%  Lisa Jackson-Moore 08/27/2013, 2:15 PM    

## 2013-08-27 NOTE — Progress Notes (Signed)
Report given to receiving unit. Attempted IV without success. Patient collected urine, but spilled all urine before lid applied. Transported to AICU via W/C.

## 2013-08-27 NOTE — Progress Notes (Signed)
Blanche EastJ. Rasch, NP discussed results and admission. Orders received from Dr. Enrigue CatenaJ-Moore.

## 2013-08-27 NOTE — Progress Notes (Signed)
Echo Lab  2D Echocardiogram completed.  Katharina CaperMelissa L Zaydee Aina, RDCS 08/27/2013 2:37 PM

## 2013-08-27 NOTE — Progress Notes (Signed)
Error, info was for 2015 assessment

## 2013-08-27 NOTE — Progress Notes (Signed)
CRITICAL VALUE ALERT  Critical value received:  Hgb 6.8, Hct 22.1  Date of notification:  08/27/2013  Time of notification:  0924  Critical value read back: yes  Nurse who received alert:  Sarajane MarekS. Akiva Josey, RNC  MD notified (1st page): Blanche EastJ. Rasch, NP  Time of first page:   MD notified (2nd page):  Time of second page:  Responding MD:   Time MD responded: [phone]

## 2013-08-27 NOTE — MAU Note (Signed)
Patient states she had a repeat cesarean section on 4-8, and was discharged on 4-11. States that during the night she started having shortness of breath and got up and tried to sleep sitting up. States when she lies down it feels like someone sitting on her chest. Feels better sitting up. States the wound vac became disconnected this am.

## 2013-08-28 LAB — COMPREHENSIVE METABOLIC PANEL
ALK PHOS: 77 U/L (ref 39–117)
ALT: 28 U/L (ref 0–35)
AST: 41 U/L — AB (ref 0–37)
Albumin: 2.6 g/dL — ABNORMAL LOW (ref 3.5–5.2)
BILIRUBIN TOTAL: 0.4 mg/dL (ref 0.3–1.2)
BUN: 9 mg/dL (ref 6–23)
CHLORIDE: 101 meq/L (ref 96–112)
CO2: 25 mEq/L (ref 19–32)
CREATININE: 0.81 mg/dL (ref 0.50–1.10)
Calcium: 7.8 mg/dL — ABNORMAL LOW (ref 8.4–10.5)
GFR calc Af Amer: >90 mL/min (ref >90)
Glucose, Bld: 86 mg/dL (ref 70–99)
POTASSIUM: 3.6 meq/L — AB (ref 3.7–5.3)
Sodium: 139 mEq/L (ref 137–147)
Total Protein: 6.6 g/dL (ref 6.0–8.3)

## 2013-08-28 LAB — CBC
HCT: 22.6 % — ABNORMAL LOW (ref 36.0–46.0)
Hemoglobin: 6.9 g/dL — CL (ref 12.0–15.0)
MCH: 23.3 pg — ABNORMAL LOW (ref 26.0–34.0)
MCHC: 30.5 g/dL (ref 30.0–36.0)
MCV: 76.4 fL — ABNORMAL LOW (ref 78.0–100.0)
PLATELETS: 384 10*3/uL (ref 150–400)
RBC: 2.96 MIL/uL — AB (ref 3.87–5.11)
RDW: 18.2 % — ABNORMAL HIGH (ref 11.5–15.5)
WBC: 8 10*3/uL (ref 4.0–10.5)

## 2013-08-28 LAB — PRO B NATRIURETIC PEPTIDE: Pro B Natriuretic peptide (BNP): 181.8 pg/mL — ABNORMAL HIGH (ref 0–125)

## 2013-08-28 MED ORDER — FUROSEMIDE 20 MG PO TABS
20.0000 mg | ORAL_TABLET | Freq: Two times a day (BID) | ORAL | Status: DC
  Administered 2013-08-28 – 2013-08-29 (×3): 20 mg via ORAL
  Filled 2013-08-28 (×3): qty 1

## 2013-08-28 MED ORDER — POTASSIUM CHLORIDE CRYS ER 20 MEQ PO TBCR
40.0000 meq | EXTENDED_RELEASE_TABLET | Freq: Once | ORAL | Status: AC
  Administered 2013-08-28: 40 meq via ORAL
  Filled 2013-08-28: qty 2

## 2013-08-28 MED ORDER — IBUPROFEN 600 MG PO TABS
600.0000 mg | ORAL_TABLET | Freq: Four times a day (QID) | ORAL | Status: DC | PRN
  Administered 2013-08-28 – 2013-08-30 (×6): 600 mg via ORAL
  Filled 2013-08-28 (×6): qty 1

## 2013-08-28 MED ORDER — LABETALOL HCL 300 MG PO TABS
300.0000 mg | ORAL_TABLET | Freq: Three times a day (TID) | ORAL | Status: DC
  Administered 2013-08-28 – 2013-08-31 (×9): 300 mg via ORAL
  Filled 2013-08-28 (×11): qty 1

## 2013-08-28 NOTE — Progress Notes (Signed)
Dr. Clearance CootsHarper notified of pts. hgb 6.9 no new orders received

## 2013-08-28 NOTE — Progress Notes (Signed)
Patient's husband called RN to room to report that he witnessed pt's head shaking for a few seconds and that her O2 level dropped.  He said only her head shook.  Ashley SloopK. Jones, RN went into room and found O2 SAT at 79%.  Pt awake and oriented.  RN added nasal canula with O2 at 2L.  O2 SAT up to 100%.  Pt stated she was awake when her head shook, but felt a "little funny".  Just prior to incident, pt had ambulated to BR with no difficulty.   This RN finished with another patient and was then informed of above.  RN assessment revealed normal leg and arm strength and movement, tongue midline when extended, and pt's cognition to be normal. Speech clear, but slightly slower than earlier in day.  Pt holding and feeding baby with bottle. Pt still reported feeling "a little funny", but no complaints other than incisional pain.  Phoned Dr. Clearance CootsHarper with above information and latest vital signs.  See order for increased labetalol dose.

## 2013-08-28 NOTE — Progress Notes (Signed)
Subjective: Postpartum Day 6: Cesarean Delivery Patient reports tolerating PO.   No problems breathing.  Objective: Vital signs in last 24 hours: Temp:  [98.1 F (36.7 C)-99 F (37.2 C)] 98.1 F (36.7 C) (04/14 0434) Pulse Rate:  [68-86] 73 (04/14 0602) Resp:  [18-20] 18 (04/14 0602) BP: (121-179)/(55-95) 148/82 mmHg (04/14 0602) SpO2:  [92 %-100 %] 97 % (04/14 0602) Weight:  [292 lb 3.2 oz (132.541 kg)-297 lb 6.4 oz (134.9 kg)] 292 lb 3.2 oz (132.541 kg) (04/14 0602)  I&O =  -3700 ml since admission  Physical Exam:  General: alert and no distress Lochia: appropriate Uterine Fundus: firm Incision: healing well DVT Evaluation: No evidence of DVT seen on physical exam. Heart: RRR.  No rubs or gallops. Lungs:  Clear  Extremities:  1+ pedal edema   Recent Labs  08/27/13 0830 08/28/13 0520  HGB 6.8* 6.9*  HCT 22.1* 22.6*    Assessment/Plan: Status post Cesarean section. Postoperative course complicated by preeclampsia with pulmonary edema.  Improved.  Continue current management. Anemia.  Clinically stable.  Iron started.  Check labs.Brock Bad.  Charles A Harper 08/28/2013, 8:52 AM

## 2013-08-28 NOTE — Progress Notes (Signed)
UR chart review completed.  

## 2013-08-29 LAB — COMPREHENSIVE METABOLIC PANEL
ALT: 26 U/L (ref 0–35)
AST: 40 U/L — ABNORMAL HIGH (ref 0–37)
Albumin: 2.5 g/dL — ABNORMAL LOW (ref 3.5–5.2)
Alkaline Phosphatase: 77 U/L (ref 39–117)
BILIRUBIN TOTAL: 0.3 mg/dL (ref 0.3–1.2)
BUN: 9 mg/dL (ref 6–23)
CHLORIDE: 101 meq/L (ref 96–112)
CO2: 25 meq/L (ref 19–32)
Calcium: 7.2 mg/dL — ABNORMAL LOW (ref 8.4–10.5)
Creatinine, Ser: 0.92 mg/dL (ref 0.50–1.10)
GFR calc non Af Amer: 81 mL/min — ABNORMAL LOW (ref >90)
GLUCOSE: 106 mg/dL — AB (ref 70–99)
POTASSIUM: 4.3 meq/L (ref 3.7–5.3)
SODIUM: 138 meq/L (ref 137–147)
Total Protein: 6.5 g/dL (ref 6.0–8.3)

## 2013-08-29 LAB — PRO B NATRIURETIC PEPTIDE: Pro B Natriuretic peptide (BNP): 93.1 pg/mL (ref 0–125)

## 2013-08-29 MED ORDER — POLYSACCHARIDE IRON COMPLEX 150 MG PO CAPS
150.0000 mg | ORAL_CAPSULE | Freq: Every day | ORAL | Status: DC
  Administered 2013-08-29 – 2013-08-30 (×2): 150 mg via ORAL
  Filled 2013-08-29 (×3): qty 1

## 2013-08-29 MED ORDER — SENNOSIDES-DOCUSATE SODIUM 8.6-50 MG PO TABS
2.0000 | ORAL_TABLET | Freq: Once | ORAL | Status: AC
  Administered 2013-08-29: 2 via ORAL
  Filled 2013-08-29: qty 2

## 2013-08-29 MED ORDER — ONDANSETRON 4 MG PO TBDP
4.0000 mg | ORAL_TABLET | Freq: Four times a day (QID) | ORAL | Status: DC | PRN
  Filled 2013-08-29: qty 1

## 2013-08-29 NOTE — Progress Notes (Signed)
Subjective: Postpartum Day 7: Cesarean Delivery Patient reports occasional lightheadedness with ambulation.   Objective: Vital signs in last 24 hours: Temp:  [97.9 F (36.6 C)-98.7 F (37.1 C)] 98.4 F (36.9 C) (04/15 1200) Pulse Rate:  [67-82] 79 (04/15 1300) Resp:  [18-20] 18 (04/15 1200) BP: (111-155)/(64-92) 155/78 mmHg (04/15 1300) SpO2:  [94 %-100 %] 96 % (04/15 1300)  Physical Exam:  General: alert and no distress Lochia: appropriate Uterine Fundus: firm Incision: healing well DVT Evaluation: No evidence of DVT seen on physical exam.   Recent Labs  08/27/13 0830 08/28/13 0520  HGB 6.8* 6.9*  HCT 22.1* 22.6*    Assessment/Plan: Status post Cesarean section. Postoperative course complicated by preeclampsia complicated by pulmonary edema.  Improved.  D/C Lasix.  Continue magnesium sulfate.  Anemia.  Clinically stable.  Iron started.  [name] 08/29/2013, 1:33 PM

## 2013-08-29 NOTE — Consult Note (Signed)
Lactation note: Mom states she hasn't been pumping much due to not feeling well.  Mom states if she obtains any milk she is only comfortable dumping her milk until she is not on all the medications.  Reassured mom milk would be safe but it's important for her to do what she is comfortable with. Mom teary eyed when talking about not being able to breast feed her first child due to maternal complications.  She states she really wants to breastfeed this baby.  Reviewed supply and demand and encouraged her to begin pumping every 3 hours.  Offered assist as needed.

## 2013-08-30 NOTE — Progress Notes (Signed)
Subjective: Postpartum Day 8: Cesarean Delivery No complaints.  Objective: Vital signs in last 24 hours: Temp:  [98.3 F (36.8 C)-98.8 F (37.1 C)] 98.3 F (36.8 C) (04/16 0831) Pulse Rate:  [69-80] 75 (04/16 0831) Resp:  [16-20] 18 (04/16 0831) BP: (111-156)/(61-95) 114/91 mmHg (04/16 0502) SpO2:  [93 %-100 %] 99 % (04/16 0831) Weight:  [280 lb 6.4 oz (127.189 kg)] 280 lb 6.4 oz (127.189 kg) (04/16 0831)  Physical Exam:  General: alert and no distress Lochia: appropriate Uterine Fundus: firm Incision: healing well DVT Evaluation: No evidence of DVT seen on physical exam.   Recent Labs  08/28/13 0520  HGB 6.9*  HCT 22.6*    Assessment/Plan: Status post Cesarean section. Doing well postoperatively.  Magnesium off.  BP stable.  Transfer to floor.  Anticipate discharge home tomorrow on po Labetalol.  [name] Johnae Friley 08/30/2013, 9:54 AM

## 2013-08-30 NOTE — Lactation Note (Signed)
Lactation Consultation Note  Patient Name: Ashley Nielsen ZOXWR'UToday's Date: 08/30/2013   Mom has been pumping and dumping stating that she did not want baby to get exposed to Mag Sulfate. Now she is off meds and reports she is starting to re-introduce baby to breast but has LMS and needs to continue to supplement. Discussed plan options with Mom based on what she desires: Advised she could pump and bottle feed if desired, she must pump every 3 hours for 15 minutes including at night. Option 2: BF each feeding, then let FOB give supplement while she pumps to increase milk supply and to have EBM to supplement. Information given to Mom on Fenugreek supplements and More Milk Plus to increase milk supply. Advised Mom to advise if she would like LC assist.    Maternal Data    Feeding    LATCH Score/Interventions                      Lactation Tools Discussed/Used     Consult Status      Ashley Nielsen 08/30/2013, 10:10 AM

## 2013-08-31 MED ORDER — IBUPROFEN 600 MG PO TABS: 600.0000 mg | ORAL_TABLET | Freq: Four times a day (QID) | ORAL | Status: AC | PRN

## 2013-08-31 MED ORDER — LABETALOL HCL 300 MG PO TABS: 300.0000 mg | ORAL_TABLET | Freq: Three times a day (TID) | ORAL | Status: AC

## 2013-08-31 MED ORDER — OXYCODONE-ACETAMINOPHEN 5-325 MG PO TABS: 1.0000 | ORAL_TABLET | Freq: Four times a day (QID) | ORAL | Status: AC | PRN

## 2013-08-31 MED ORDER — FUSION PLUS PO CAPS: 1.0000 | ORAL_CAPSULE | Freq: Every day | ORAL | Status: AC

## 2013-08-31 NOTE — Progress Notes (Signed)
Subjective: Postpartum Day 9: Cesarean Delivery Patient reports tolerating PO, + flatus, + BM and no problems voiding.    Objective: Vital signs in last 24 hours: Temp:  [97.8 F (36.6 C)-98.7 F (37.1 C)] 97.8 F (36.6 C) (04/17 0514) Pulse Rate:  [67-75] 71 (04/17 0514) Resp:  [18-20] 18 (04/17 0514) BP: (133-160)/(57-77) 160/77 mmHg (04/17 0514) SpO2:  [98 %-99 %] 99 % (04/17 0514) Weight:  [280 lb 6.4 oz (127.189 kg)-284 lb (128.822 kg)] 284 lb (128.822 kg) (04/17 0514)  Physical Exam:  General: alert and no distress Lochia: appropriate Uterine Fundus: firm Incision: healing well DVT Evaluation: No evidence of DVT seen on physical exam.  No results found for this basename: HGB, HCT,  in the last 72 hours  Assessment/Plan: Status post Cesarean section. Doing well postoperatively.  Discharge home with standard precautions and return to clinic in 2 weeks.  [name] Corwin Kuiken 08/31/2013, 8:02 AM

## 2013-08-31 NOTE — Progress Notes (Signed)
Pt discharged home with husband... Condition stable... No equipment.. Ambulated to car with L. Snyder, NT.  

## 2013-08-31 NOTE — Discharge Summary (Signed)
Obstetric Discharge Summary Reason for Admission: Preeclampsia with pulmonary edema Prenatal Procedures: ultrasound Intrapartum Procedures: cesarean: low cervical, transverse Postpartum Procedures: magnesium sulfate and labetalol Complications-Operative and Postpartum: postpartum preeclampsia Hemoglobin  Date Value Ref Range Status  08/28/2013 6.9* 12.0 - 15.0 g/dL Final     REPEATED TO VERIFY     CRITICAL RESULT CALLED TO, READ BACK BY AND VERIFIED WITH:     SPOKE TO SCOTTL @ 0606 ON [id number] BY BOSTONC     HCT  Date Value Ref Range Status  08/28/2013 22.6* 36.0 - 46.0 % Final    Physical Exam:  General: alert and no distress Lochia: appropriate Uterine Fundus: firm Incision: healing well DVT Evaluation: No evidence of DVT seen on physical exam.  Discharge Diagnoses: Postpartum preeclampsia  Discharge Information: Date: 08/31/2013 Activity: pelvic rest Diet: routine Medications: PNV, Ibuprofen, Iron, Percocet and Labetalol Condition: improved Instructions: refer to practice specific booklet Discharge to: home Follow-up Information   Follow up with HARPER,CHARLES A, MD In 2 weeks.   Specialty:  Obstetrics and Gynecology   Contact information:   [address] [place] [id number] [phone]       Newborn Data: <<This visit is not associated with a pregnancy. Delivery information will not be displayed.>> Home with mother.  [name] 08/31/2013, 8:22 AM

## 2013-09-20 ENCOUNTER — Encounter: Payer: Self-pay | Admitting: Obstetrics

## 2013-09-20 ENCOUNTER — Ambulatory Visit (INDEPENDENT_AMBULATORY_CARE_PROVIDER_SITE_OTHER): Admitting: Obstetrics

## 2013-09-20 VITALS — BP 138/89 | HR 69 | Temp 98.4°F | Ht 70.0 in | Wt 265.0 lb

## 2013-09-20 DIAGNOSIS — O924 Hypogalactia: Secondary | ICD-10-CM

## 2013-09-20 DIAGNOSIS — O9270 Unspecified disorders of lactation: Secondary | ICD-10-CM

## 2013-09-20 MED ORDER — METOCLOPRAMIDE HCL 5 MG PO TABS: 5.0000 mg | ORAL_TABLET | ORAL | Status: AC

## 2013-09-20 NOTE — Progress Notes (Signed)
Subjective:     Ashley Nielsen is a 33 y.o. female who presents for a postpartum visit. She is 4 weeks postpartum following a low cervical transverse Cesarean section. I have fully reviewed the prenatal and intrapartum course. The delivery was at 39 gestational weeks. Outcome: repeat cesarean section, low transverse incision. Anesthesia: spinal. Postpartum course has been normal except for decreased lactation. Baby's course has been normal. Baby is feeding by breast. Bleeding thin lochia. Bowel function is normal. Bladder function is normal. Patient is not sexually active. Contraception method is abstinence. Postpartum depression screening: negative.  The following portions of the patient's history were reviewed and updated as appropriate: allergies, current medications, past family history, past medical history, past social history, past surgical history and problem list.  Review of Systems Decreased milk production.   Objective:    BP 138/89  Pulse 69  Temp(Src) 98.4 F (36.9 C)  Ht 5\' 10"  (1.778 m)  Wt 265 lb (120.203 kg)  BMI 38.02 kg/m2  Breastfeeding? Yes  General:  alert and no distress Breasts:  Negative. Abdomen:  Soft, NT.  Incision C, D, I.     Assessment:     Normal postpartum exam. Pap smear not done at today's visit.   Counseling for contraception done.  She's undecided. Decreased milk production.  Plan:    1. Contraception: abstinence 2. Contraceptive options discussed. 3. Follow up in: 4 weeks or as needed.  4. Reglan Rx for decreased milk production.

## 2013-10-18 ENCOUNTER — Ambulatory Visit: Admitting: Obstetrics

## 2014-03-18 ENCOUNTER — Encounter: Payer: Self-pay | Admitting: Obstetrics

## 2014-09-08 NOTE — Consult Note (Signed)
Referral Information:   Reason for Referral Gestational diabetes, persistent nausea and vomiting and weight loss, elevated BMI    Referring Physician Geroge BasemanE Newsome CNM  , Dr Jean RosenthalJackson Christell Constant-Moore, Dr Alice ReichertHarper  [facility]    Prenatal Hx 34 yo G1 p0 at 36 2/7 with LMP 5/29 /2012 EDC of 07/20/2011 with diagnosis of gestational diabetes - Patient received early care in [place] - moved to [place] in December to live with her mother after her husband was stationed in [place] . Negative Tetra screen . She had a 2 hour GTT with a FBS of 80 /174 1hour /194 2hour . 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 OB ultrasound  done today [facility] 89% [facility] , AFI is normal.RBS today 95 positive  urine  for Group BStrep, Bothered by carpal tunnel for whichshe has wrist  braces . Patient says she has had N&V through pregnancy . She has found Zofran and Phenergan to not be helpful due to side efects - she declined Reglan . She has lost about 15 pounds this pregnancy but still has a BMI of 42. She ahs adopted an usual eating pattern of crackers after awakening at 10 and eating her first meal at 3 pm followed by antoher meal later in the day . She arrived today at noon to clinic eating a bojangles steak biscuit.    Past Obstetrical Hx gravida 1   Home Medications:  Medication Instructions Status  Prenatal Multivitamins oral tablet 1 tab(s) orally once a day Active   Allergies:   No Known Allergies:   Vital Signs/Notes:  Nursing Vital Signs:  **Vital Signs.:   07-Feb-13 11:15   Vital Signs Type Routine   Temperature Temperature (F) 99.5   Celsius 37.5   Temperature Source oral   Pulse Pulse 101   Pulse source per Dinamap   Respirations Respirations 16   Systolic BP Systolic BP 139   Diastolic BP (mmHg) Diastolic BP (mmHg) 88   Mean BP 105   BP Source Dinamap   Perinatal  Consult:   LMP 13-Oct-2010    PGyn Hx abnormal Paps  s/p colpo    PMed Hx Rubella Immune, Hx of varicella, neg CF screen    Past Medical History cont'd - 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 - h/o sinus abcess surgically drained    PSurg Hx abcess drained , colposcopy    FHx metabolic disorders either side, mother with diabetes on pills    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    Soc Hx married   Review Of Systems:   Subjective difficutly with oral intake - has to eat when able    Nausea/Vomiting Yes  persistent through pregnancy     Medications/Allergies Reviewed Medications/Allergies reviewed   Exam:   Today's Weight 295lb  71 inche     Routine UA:  07-Feb-13 11:20    Color (UA) Amber   Glucose (UA) Negative   Bilirubin (UA) Negative   Ketones (UA) 2+   Specific Gravity (UA) 1.026   Blood (UA) Negative   pH (UA) 5.0   Nitrite (UA) Negative   Leukocyte Esterase (UA) Negative   RBC (UA) 3 /HPF   WBC (UA) 3 /HPF   Epithelial Cells (UA) 16 /HPF   Mucous (UA) PRESENT   Granular  Cast (UA) 4 /LPF  Blood Glucose:  07-Feb-13 12:31    POCT Blood Glucose 95     Additional Lab/Radiology Notes u/s today AFI =15 cephalic EFW 89% AC 2 weeks ahead   Impression/Recommendations:   Impression IUP at 36 w2d Gestational diabetes- dx on 2 hour test due to elevated 2h >190, RBS 95 - high  risk for Type 2 DM  Elevated BMI Persistent nausea and vomiting Carpal tunnel -using wrist splints Group B strep bacteruria needs pcn in labor    Recommendations 1-I reviewed with the patient concerns related to diabetes in pregnancy including macrosomia, shoulder dystocia/traumatic birth, polyhydramnios, stillbirth, neonatal hypoglycemia and hyperbilirubinemia and longterm risk of diabetes for herself and her unborn daughter "Ava" . I reviewed strategies for controlling blood sugar including distributing carbs in small increments in  3 meals and 3 snacks.  Pt points out her nausea may make this difficult. I Rxd a glucometer and referred her to the [facility] to discuss diabetes care. Hgb A1c was drawn  2 We discussed risk of difficult birth with the increased AC but patient is almost 6 feet tall and wears a size 11 shoe - so reasonable to labor if EFW <4.5 kg . If measuring size > dates we can re-check an EFW in 2 -3 weeks. I recommended weekly NST and AFI - pt says she is able to do NSTS at her primary OB office bt [employer] amy require her to come here for AFI- I schedueld her to return in one week for BS review and BPP. I recommended induction of labor at 39-40 weeks but if BS log is abnormal can consider amnio and induction at 37 weeks. 3. pt at high risk of persistent T2 DM - discussed healthy lifestyle after delivery  4 Unusual N&V pattern lasting into the 3rd trimester, pt declines further meds and appeared to have tolerated her biscuit in clinic. If persists after delivery consider GI referral for upper endoscopy   Plan:   Antepartum Testing Weekly    Delivery Mode Vaginal    Additional Testing HbA1C    Delivery at what gestational age [redacted] weeks     Total Time Spent with Patient 30 minutes    >50% of visit spent in couseling/coordination of care yes    Office Use Only 99242  Level 2 ( ) NEW office consult exp prob focused   Coding Description: MATERNAL CONDITIONS/HISTORY INDICATION(S).   Diabetes - Gestational GDM.   Obesity - BMI greater than equal to 30.  Electronic Signatures: Rondall Allegra (MD)  (Signed 07-Feb-13 14:10)  Authored: Referral, Home Medications, Allergies, Vital Signs/Notes, Consult, Exam, Lab, Lab/Radiology Notes, Impression, Plan, Billing, Coding Description   Last Updated: 07-Feb-13 14:10 by Rondall Allegra (MD)

## 2014-09-08 NOTE — Consult Note (Signed)
Referral Information:   Reason for Referral Gestational diabetes, persistent nausea and vomiting and weight loss, elevated BMI    Referring Physician Geroge Baseman CNM  , Dr Jean Rosenthal Christell Constant, Dr Alice Reichert [facility]    Prenatal Hx 34 yo G1 p0 at 36 2/7 with LMP 5/29 /2012 EDC of 07/20/2011 with diagnosis of gestational diabetes - Patient received early care in [place] - moved to [place] in December to live with her mother after her husband was stationed in [place] . Negative Tetra screen . She had a 2 hour GTT with a FBS of 80 /174 1hour /194 2hour . 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 OB ultrasound  done today shows EFW 89% and the [facility] is ahead by 2 weeks , AFI is normal.RBS today 95 positive  urine  for Group BStrep, Bothered by carpal tunnel for whichshe has wrist  braces . Patient says she has had N&V through pregnancy . She has found Zofran and Phenergan to not be helpful due to side efects - she declined Reglan . She has lost about 15 pounds this pregnancy but still has a BMI of 42. She ahs adopted an usual eating pattern of crackers after awakening at 10 and eating her first meal at 3 pm followed by antoher meal later in the day . She arrived today at noon to clinic eating a bojangles steak biscuit.    Past Obstetrical Hx gravida 1   Home Medications: Medication Instructions Status  Prenatal Multivitamins oral tablet 1 tab(s) orally once a day Active   Allergies:   No Known Allergies:   Vital Signs/Notes:  Nursing Vital Signs: **Vital Signs.:   21-Feb-13 14:48   Vital Signs Type Routine   Pulse Pulse 101   Pulse source per Dinamap   Respirations Respirations 14   Systolic BP Systolic BP 146   Diastolic BP (mmHg) Diastolic BP (mmHg) 74   Mean BP 98   BP Source Dinamap   Perinatal Consult:   LMP 13-Oct-2010    PGyn Hx abnormal Paps  s/p colpo    PMed Hx  Rubella Immune, Hx of varicella, neg CF screen    Past Medical History cont'd - 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 - h/o sinus abcess surgically drained    PSurg Hx abcess drained , colposcopy    FHx metabolic disorders either side, mother with diabetes on pills    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    Soc Hx married   Review Of Systems:   Nausea/Vomiting Yes  persistent through pregnancy    Medications/Allergies Reviewed Medications/Allergies reviewed   Exam:   Today's Weight 297lb  2lbincre   Blood Glucose:  21-Feb-13 14:45    POCT Blood Glucose 125     Additional Lab/Radiology Notes BPP 8/8 RBS 125  Hgb A1c on 2/7 was 8.1%   Impression/Recommendations:   Impression IUP at 38 w2d Gestational diabetes- poor adherence to care plan missed [facility] BPP last week , [facility], pt did not bring glucometer or monitoring log today  increased AC on last EFW  Elevated BMI- gained 2 pounds Persistent nausea and vomiting- gaining weight Carpal tunnel -using wrist splints Group B strep bacteruria needs pcn in labor    Recommendations i am concerned about elevated HGBA1c and lack of close  follow up by patient- RBS today is 125 which is better than the HGBA1c woudl suggest is typical [facility] and suggested amniocentesis and delivery if mature given 38 weeks . If not consider admission to monitor Blood sugar since unclear how her current control runs.  I advised pt to monitor sugars until delivery   Plan:   Antepartum Testing Weekly    Delivery Mode Vaginal    Delivery at what gestational age < 39 weeks, with amnio for lung maturity     Total Time Spent with Patient 15 minutes    >50% of visit spent in couseling/coordination of care yes    Office Use Only 99213  Office Visit Level 3 (15min) EST exp prob focused outpt   Coding Description: MATERNAL  CONDITIONS/HISTORY INDICATION(S).  Electronic Signatures: Rondall AllegraLivingston, Allycia Pitz [place]am (MD)  (Signed 21-Feb-13 16:00)  Authored: Referral, Home Medications, Allergies, Vital Signs/Notes, Consult, Exam, Lab, Lab/Radiology Notes, Impression, Plan, Billing, Coding Description   Last Updated: 21-Feb-13 16:00 by Rondall AllegraLivingston, Sareen Randon Gresham (MD)

## 2015-03-04 IMAGING — CT CT ANGIO CHEST
1 of 3 series · 19 of 32 positions shown · IV contrast (OMNIPAQUE)
Comparison: Chest radiograph August 27, 2013

CLINICAL DATA: Shortness of Breath

EXAM:
CT ANGIOGRAPHY CHEST WITH CONTRAST
TECHNIQUE: Multidetector CT imaging of the chest was performed using the
standard protocol during bolus administration of intravenous
contrast. Multiplanar CT image reconstructions and MIPs were
obtained to evaluate the vascular anatomy.
CONTRAST:  100mL OMNIPAQUE IOHEXOL 350 MG/ML SOLN

[Series 7: (person_name) thins · axial · 0.69mm/px · z∈[+900,+1136]mm · 19 of 376 slices shown]
[im 19/376  lung]
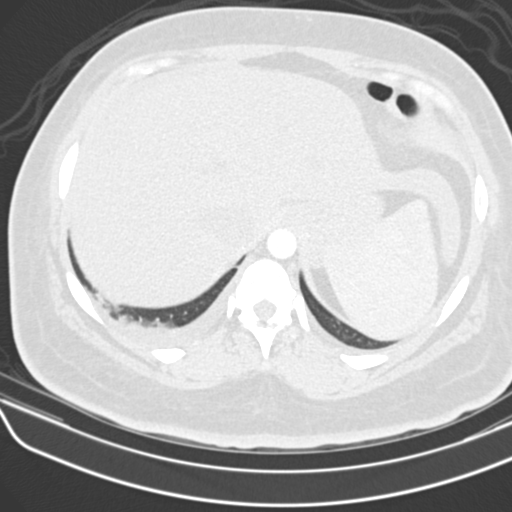
[im 38/376  mediastinal]
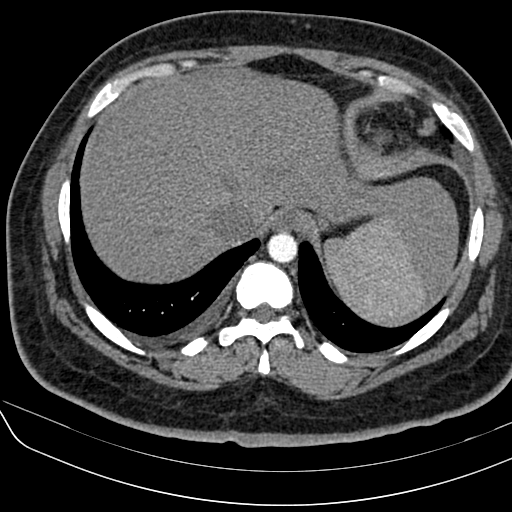
[im 57/376  lung]
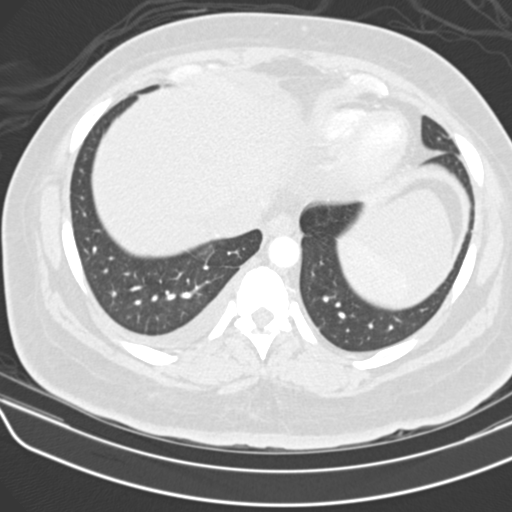
[im 94/376  mediastinal]
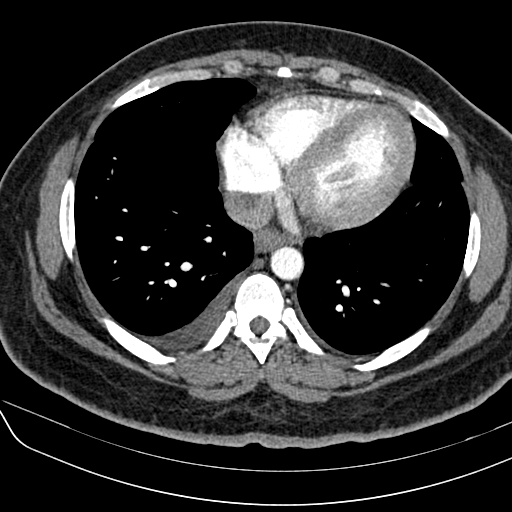
[im 113/376  lung]
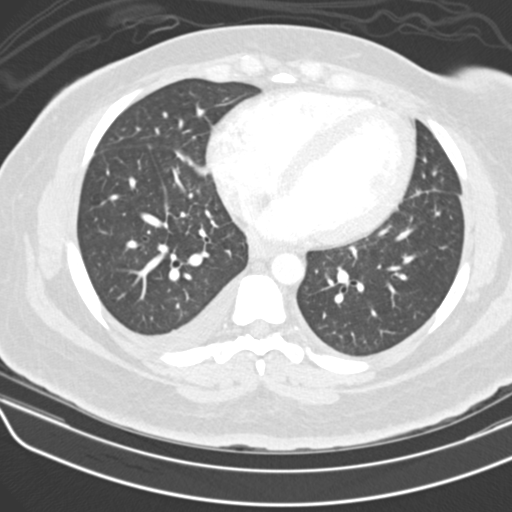
[im 126/376  mediastinal]
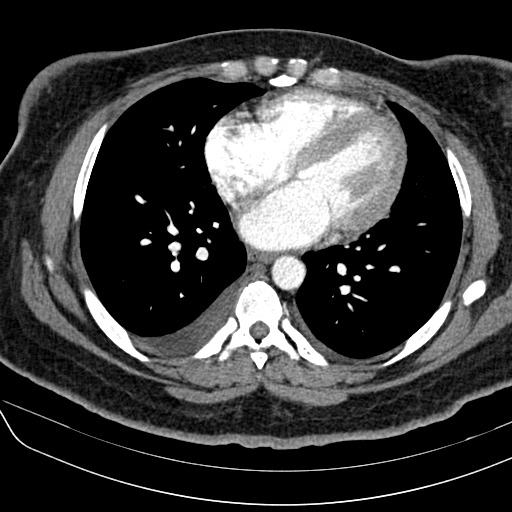
[im 132/376  lung]
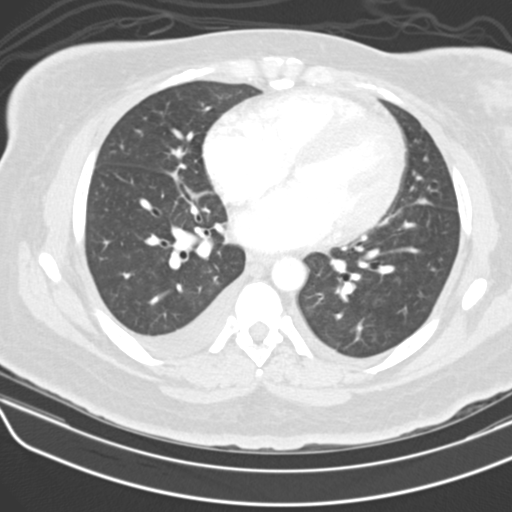
[im 151/376  mediastinal]
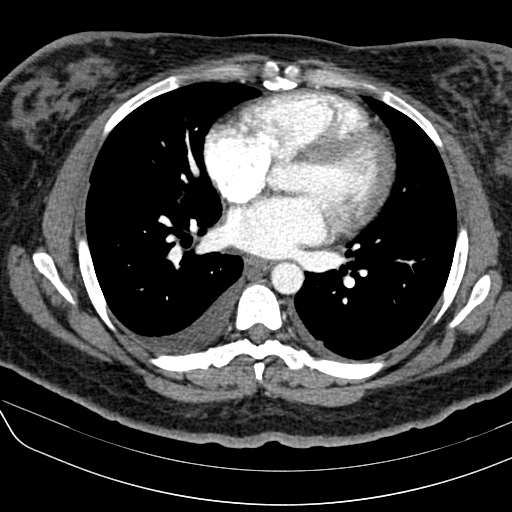
[im 169/376  lung]
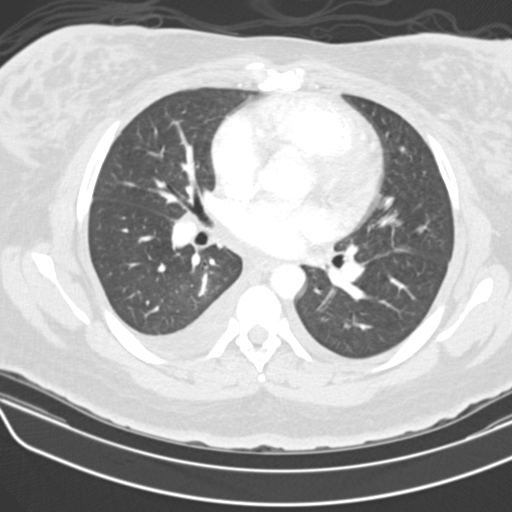
[im 188/376  mediastinal]
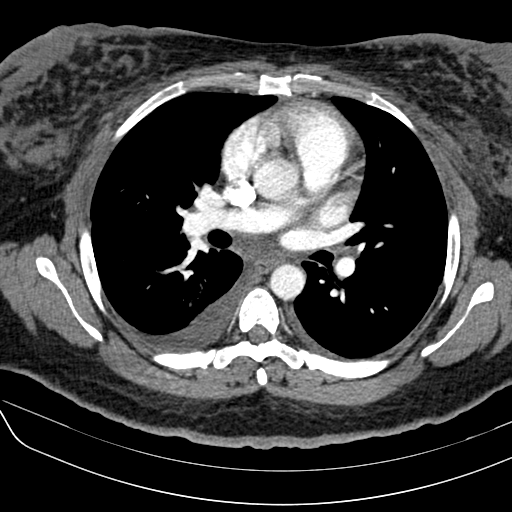
[im 207/376  lung]
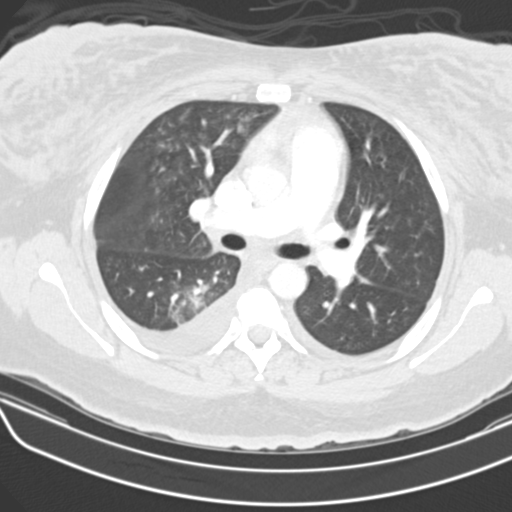
[im 226/376  mediastinal]
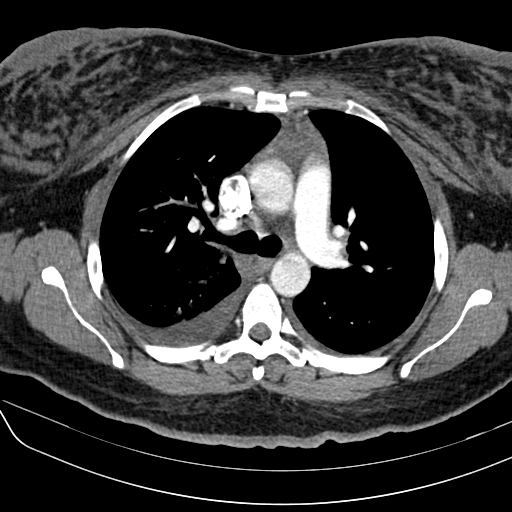
[im 244/376  lung]
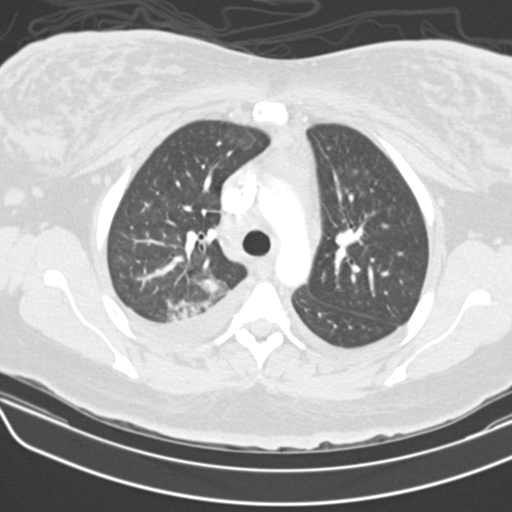
[im 251/376  mediastinal]
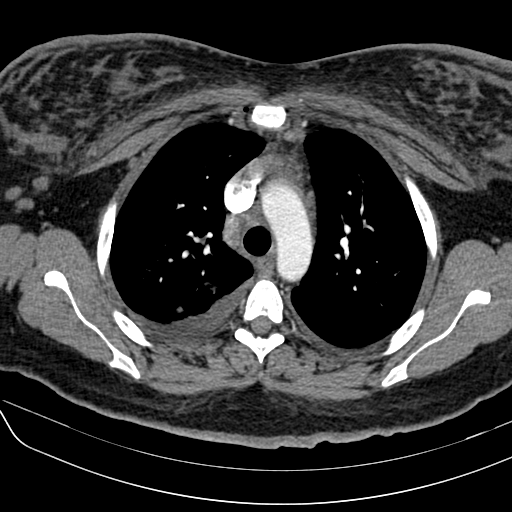
[im 263/376  lung]
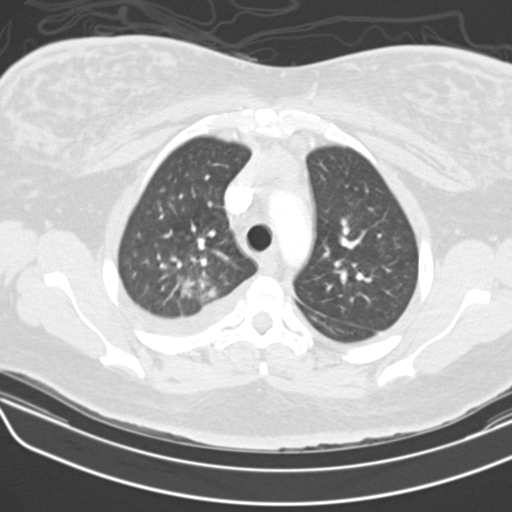
[im 282/376  mediastinal]
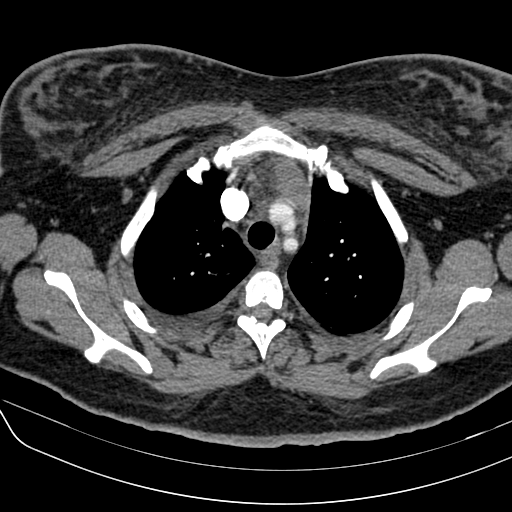
[im 319/376  lung]
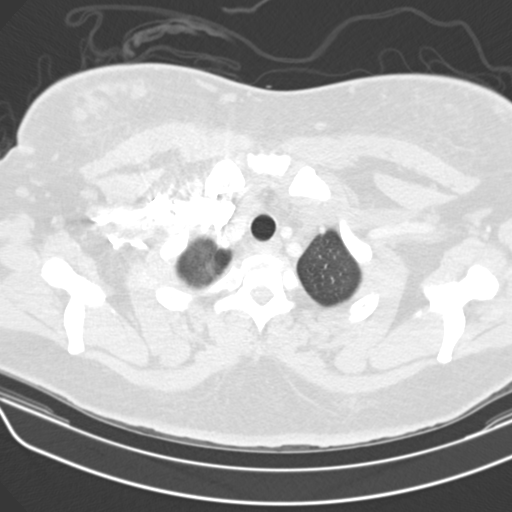
[im 338/376  mediastinal]
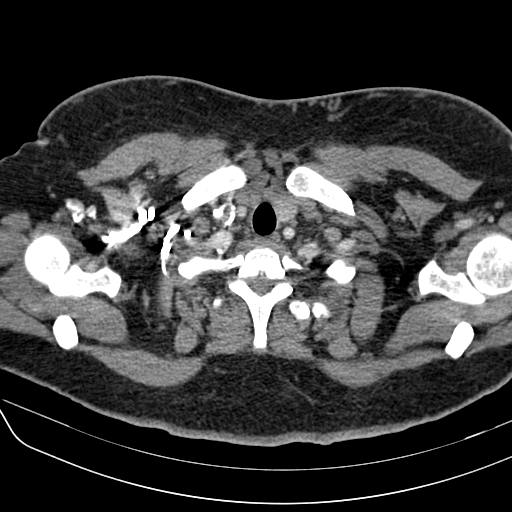
[im 357/376  lung]
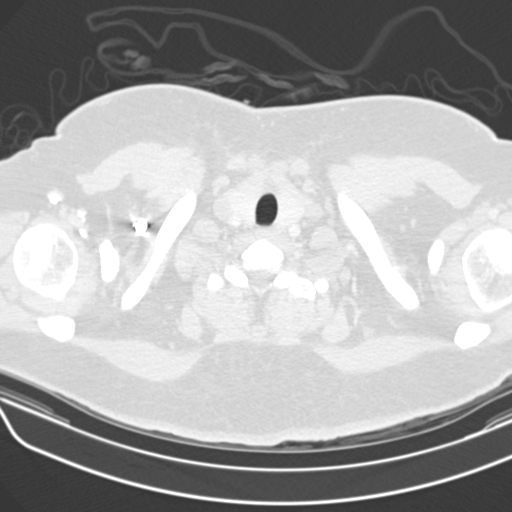

[19 of 32 positions shown; findings below may reference images not displayed]

FINDINGS: There is no demonstrable pulmonary embolus. There is no thoracic
aortic aneurysm or dissection.

There is a moderate pleural effusion on the right. There is a
minimal pleural effusion on the left. There is patchy airspace
consolidation in the superior segment right lower lobe as well as in
portions of the anterior and posterior segment of the right upper
lobe. There is some minimal patchy infiltrate in the anterior
segment of the left upper lobe. The left lung is otherwise clear.

There is no appreciable thoracic adenopathy. The pericardium is not
thickened. There is thymic tissue, a finding that may be normal for
age.

In the visualized upper abdomen, there is fatty change in the liver.
Visualized upper abdomen otherwise appears normal. There are no
blastic or lytic bone lesions.

Review of the MIP images confirms the above findings.
IMPRESSION: No demonstrable pulmonary embolus. Areas of infiltrate bilaterally,
more on the right than on the left. Moderate right effusion with
minimal left effusion. Fatty liver.

## 2020-02-11 NOTE — Telephone Encounter (Signed)
[facility] call [facility] with [facility].    Brief description of triage:   High blood pressure  Triage indicates for patient to be seen in the office within the next three days, patient encouraged to go to the [facility] if no available appointments    Care advice provided, patient verbalizes understanding; denies any other questions or concerns; instructed to call back for any new or worsening symptoms.    Writer provided warm transfer to April at [facility] for appointment scheduling.    Attention Provider:  Thank you for allowing me to participate in the care of your patient.  The patient was connected to triage in response to information provided to the [facility].  Please do not respond through this encounter as the response is not directed to a shared pool.            Reason for Disposition  ??? Systolic BP >= 160 OR Diastolic >= 100    Answer Assessment - Initial Assessment Questions  1. BLOOD PRESSURE: "What is the blood pressure?" "Did you take at least two measurements 5 minutes apart?"      190/102, 160/90    2. ONSET: "When did you take your blood pressure?"      The first measurement was two days ago the second was this morning    3. HOW: "How did you obtain the blood pressure?" (e.g., visiting nurse, automatic home BP monitor)      Home BP    4. HISTORY: "Do you have a history of high blood pressure?"      Since December of 2020    5. MEDICATIONS: "Are you taking any medications for blood pressure?" "Have you missed any doses recently?"      Yes, has missed medications, when it was going down she stopped taking the medication    6. OTHER SYMPTOMS: "Do you have any symptoms?" (e.g., headache, chest pain, blurred vision, difficulty breathing, weakness)      Tight muscles    7. PREGNANCY: "Is there any chance you are pregnant?" "When was your last menstrual period?"      No, 2 and a half weeks ago    Protocols used: HIGH BLOOD PRESSURE-ADULT-[place]

## 2020-02-22 ENCOUNTER — Ambulatory Visit: Admit: 2020-02-22 | Discharge: 2020-02-22 | Payer: PRIVATE HEALTH INSURANCE | Attending: Family | Primary: Family

## 2020-02-22 ENCOUNTER — Encounter

## 2020-02-22 DIAGNOSIS — R079 Chest pain, unspecified: Secondary | ICD-10-CM

## 2020-02-22 LAB — COMPREHENSIVE METABOLIC PANEL
ALT: 9 U/L — ABNORMAL LOW (ref 10–40)
AST: 14 U/L — ABNORMAL LOW (ref 15–37)
Albumin/Globulin Ratio: 0.9 — ABNORMAL LOW (ref 1.1–2.2)
Albumin: 3.8 g/dL (ref 3.4–5.0)
Alkaline Phosphatase: 51 U/L (ref 40–129)
Anion Gap: 12 (ref 3–16)
BUN: 12 mg/dL (ref 7–20)
CO2: 25 mmol/L (ref 21–32)
Calcium: 8.9 mg/dL (ref 8.3–10.6)
Chloride: 103 mmol/L (ref 99–110)
Creatinine: 0.7 mg/dL (ref 0.6–1.1)
GFR African American: >60 (ref >60)
GFR Non-African American: >60 (ref >60)
Globulin: 4.1 g/dL
Glucose: 82 mg/dL (ref 70–99)
Potassium: 4.7 mmol/L (ref 3.5–5.1)
Sodium: 140 mmol/L (ref 136–145)
Total Bilirubin: 0.3 mg/dL (ref 0.0–1.0)
Total Protein: 7.9 g/dL (ref 6.4–8.2)

## 2020-02-22 LAB — CBC WITH AUTO DIFFERENTIAL
Basophils %: 0.6 %
Basophils Absolute: 0 10*3/uL (ref 0.0–0.2)
Eosinophils %: 2.5 %
Eosinophils Absolute: 0.2 10*3/uL (ref 0.0–0.6)
Hematocrit: 30 % — ABNORMAL LOW (ref 36.0–48.0)
Hemoglobin: 8.8 g/dL — ABNORMAL LOW (ref 12.0–16.0)
Lymphocytes %: 24.5 %
Lymphocytes Absolute: 1.8 10*3/uL (ref 1.0–5.1)
MCH: 19.2 pg — ABNORMAL LOW (ref 26.0–34.0)
MCHC: 29.3 g/dL — ABNORMAL LOW (ref 31.0–36.0)
MCV: 65.6 fL — ABNORMAL LOW (ref 80.0–100.0)
MPV: 8.1 fL (ref 5.0–10.5)
Monocytes %: 4.2 %
Monocytes Absolute: 0.3 10*3/uL (ref 0.0–1.3)
Neutrophils %: 68.2 %
Neutrophils Absolute: 5 10*3/uL (ref 1.7–7.7)
PLATELET SLIDE REVIEW: ADEQUATE
Platelets: 411 10*3/uL (ref 135–450)
RBC: 4.58 M/uL (ref 4.00–5.20)
RDW: 19.4 % — ABNORMAL HIGH (ref 12.4–15.4)
WBC: 7.4 10*3/uL (ref 4.0–11.0)

## 2020-02-22 LAB — TSH WITH REFLEX: TSH: 1.87 u[IU]/mL (ref 0.27–4.20)

## 2020-02-22 LAB — LIPID PANEL
Cholesterol, Total: 146 mg/dL (ref 0–199)
HDL: 51 mg/dL (ref 40–60)
LDL Calculated: 87 mg/dL (ref <100)
Triglycerides: 40 mg/dL (ref 0–150)
VLDL Cholesterol Calculated: 8 mg/dL

## 2020-02-22 LAB — HEPATITIS C ANTIBODY: Hep C Ab Interp: NONREACTIVE

## 2020-02-22 LAB — VITAMIN B12 & FOLATE
Folate: 6.33 ng/mL (ref 4.78–24.20)
Vitamin B-12: 438 pg/mL (ref 211–911)

## 2020-02-22 LAB — IRON AND TIBC
Iron Saturation: 4 % — ABNORMAL LOW (ref 15–50)
Iron: 12 ug/dL — ABNORMAL LOW (ref 37–145)
TIBC: 302 ug/dL (ref 260–445)

## 2020-02-22 LAB — POCT URINE PREGNANCY: Preg Test, Ur: NEGATIVE

## 2020-02-22 MED ORDER — AMLODIPINE BESYLATE 5 MG PO TABS
5 MG | ORAL_TABLET | Freq: Every day | ORAL | 1 refills | Status: DC
Start: 2020-02-22 — End: 2020-03-17

## 2020-02-23 LAB — HIV SCREEN
HIV ANTIGEN: NONREACTIVE
HIV Ag/Ab: NONREACTIVE
HIV-1 Antibody: NONREACTIVE
HIV-2 Ab: NONREACTIVE

## 2020-02-23 LAB — HEMOGLOBIN A1C
Hemoglobin A1C: 5.7 %
eAG: 116.9 mg/dL

## 2020-02-23 LAB — FERRITIN: Ferritin: 6.8 ng/mL — ABNORMAL LOW (ref 15.0–150.0)

## 2020-02-25 ENCOUNTER — Encounter

## 2020-02-25 LAB — BLOOD SMEAR REVIEW

## 2020-02-25 MED ORDER — FERROUS SULFATE 325 (65 FE) MG PO TABS
325 (65 Fe) MG | ORAL_TABLET | Freq: Two times a day (BID) | ORAL | 1 refills | Status: AC
Start: 2020-02-25 — End: ?

## 2020-02-26 LAB — PATH INTERP ELEC HGB

## 2020-02-26 LAB — HEMOGLOBINOPATHY EVALUATION

## 2020-03-15 ENCOUNTER — Encounter

## 2020-03-17 MED ORDER — AMLODIPINE BESYLATE 5 MG PO TABS
5 MG | ORAL_TABLET | ORAL | 1 refills | Status: DC
Start: 2020-03-17 — End: 2020-03-18

## 2020-03-18 ENCOUNTER — Ambulatory Visit
Admit: 2020-03-18 | Discharge: 2020-03-18 | Payer: PRIVATE HEALTH INSURANCE | Attending: Cardiovascular Disease | Primary: Family

## 2020-03-18 DIAGNOSIS — R0600 Dyspnea, unspecified: Secondary | ICD-10-CM

## 2020-03-18 DIAGNOSIS — R0609 Other forms of dyspnea: Secondary | ICD-10-CM

## 2020-03-18 MED ORDER — AMLODIPINE BESYLATE 10 MG PO TABS
10 MG | ORAL_TABLET | Freq: Every day | ORAL | 5 refills | Status: DC
Start: 2020-03-18 — End: 2020-05-08

## 2020-03-18 NOTE — Progress Notes (Signed)
Cardiology Consultation     Debra Griffin  09-Apr-1981      Referring Physician: Lorie Phenix, APRN-CNP  Reason for Referral: Dyspnea/Hypertension  Chief Complaint:   Chief Complaint   Patient presents with   ??? New Patient     pt states she was referred d/t BP issues       Subjective:   History of Present Illness: The patient is 39 y.o. female with a past medical history significant for hypertension.   Today, she is here as a new patient to be evaluated for elevated blood pressure. She was started on Norvasc with her PCP. She denies having any family history of cardiac disease. She admits that she does not exercise much. She does have notice that she has had increased fatigue. She has been taking iron and has felt more energy since taking. Patient denies exertional chest pain/pressure, dyspnea at rest, PND, orthopnea, palpitations, lightheadedness, weight changes, changes in LE edema, and syncope.     Past Medical History:   Diagnosis Date   ??? Anxiety    ??? Headache    ??? Irritable bowel syndrome    ??? Tuberculosis 1998     Past Surgical History:   Procedure Laterality Date   ??? CESAREAN SECTION      x2   ??? LYMPH NODE BIOPSY      + TB     Family History   Problem Relation Age of Onset   ??? Diabetes Mother    ??? Hypertension Mother    ??? Kidney Disease Mother    ??? No Known Problems Father    ??? Hypertension Sister    ??? Hypertension Brother    ??? No Known Problems Brother    ??? No Known Problems Brother    ??? Heart Failure Maternal Grandmother    ??? Heart Attack Maternal Grandmother    ??? Diabetes Maternal Grandfather      Social History     Tobacco Use   ??? Smoking status: Never Smoker   ??? Smokeless tobacco: Never Used   Vaping Use   ??? Vaping Use: Never assessed   Substance Use Topics   ??? Alcohol use: Not Currently   ??? Drug use: Never       No Known Allergies  Current Outpatient Medications   Medication Sig Dispense Refill   ??? amLODIPine (NORVASC) 10 MG tablet Take 1 tablet by mouth daily 30 tablet 5   ???  ferrous sulfate (IRON 325) 325 (65 Fe) MG tablet Take 1 tablet by mouth 2 times daily 180 tablet 1     No current facility-administered medications for this visit.       Review of Systems:  ?? Constitutional: No unanticipated weight loss. There's been no change in energy level, sleep pattern, or activity level. No fevers, chills.   ?? Eyes: No visual changes or diplopia. No scleral icterus.  ?? ENT: No Headaches, hearing loss or vertigo. No mouth sores or sore throat.  ?? Cardiovascular: as reviewed in HPI  ?? Respiratory: No cough or wheezing, no sputum production. No hemoptysis.     ?? Gastrointestinal: No abdominal pain, appetite loss, blood in stools. No change in bowel or bladder habits.  ?? Genitourinary: No dysuria, trouble voiding, or hematuria.  ?? Musculoskeletal:  No gait disturbance, no joint complaints.  ?? Integumentary: No rash or pruritis.  ?? Neurological: No headache, diplopia, change in muscle strength, numbness or tingling.   ?? Psychiatric: No anxiety or depression.  ?? Endocrine:  No temperature intolerance. No excessive thirst, fluid intake, or urination. No tremor.  ?? Hematologic/Lymphatic: No abnormal bruising or bleeding, blood clots or swollen lymph nodes.  ?? Allergic/Immunologic: No nasal congestion or hives.    Physical Exam:   BP 132/80    Pulse 70    Ht 5\' 10"  (1.778 m)    Wt 290 lb (131.5 kg)    SpO2 93%    BMI 41.61 kg/m??   Wt Readings from Last 3 Encounters:   03/18/20 290 lb (131.5 kg)   02/22/20 293 lb (132.9 kg)     Constitutional: She is oriented to person, place, and time. She appears well-developed and well-nourished. In no acute distress.   Head: Normocephalic and atraumatic. Pupils equal and round.  Neck: Neck supple. No JVP or carotid bruit appreciated. No mass and no thyromegaly present. No lymphadenopathy present.  Cardiovascular: Normal rate. Normal heart sounds. Exam reveals no gallop and no friction rub. No murmur heard.  Pulmonary/Chest: Effort normal and breath sounds normal. No  respiratory distress. She has no wheezes, rhonchi or rales.   Abdominal: Soft, non-tender. Bowel sounds are normal. She exhibits no organomegaly, mass or bruit.   Extremities: No edema. No cyanosis or clubbing. Pulses are 2+ radial/carotid bilaterally.  Neurological: No gross cranial nerve deficit. Coordination normal.   Skin: Skin is warm and dry. There is no rash or diaphoresis.   Psychiatric: She has a normal mood and affect. Her speech is normal and behavior is normal.     Lab Review:   FLP:    Lab Results   Component Value Date    TRIG 40 02/22/2020    HDL 51 02/22/2020    LDLCALC 87 02/22/2020    LABVLDL 8 02/22/2020     BUN/Creatinine:    Lab Results   Component Value Date    BUN 12 02/22/2020    CREATININE 0.7 02/22/2020     PT/INR, TNI, HGB A1C:   Lab Results   Component Value Date/Time    LABA1C 5.7 02/22/2020 08:33 AM      No results found for: CBCAUTODIF    EKG Interpretation: 02/22/2020 Sinus bradycardia    Echo:     Stress Test:     Cath:     CT:    Doppler:     All above diagnostic testing and laboratory data was independently visualized and reviewed by me (not simply review of report)       Assessment and Plan   1) Hypertension  UnControlled.   Increase Norvasc to 10 mg daily.     2) Dyspnea  With exertion  GXT  Echocardiogram    3) Fatigue  Sleep referral    Follow up as needed if testing is normal.     Thank you very much for allowing me to participate in the care of your patient. Please do not hesitate to contact me if you have any questions.      04/23/2020, MD [facility]  General, Interventional Cardiology, and Peripheral Vascular Disease   [facility]   Ph: [phone]  Fax: [phone]    This note was scribed in the presence of Dr [phone], by Lynne Logan RN    Physician Attestation:  The scribes documentation has been prepared under my direction and personally reviewed by me in its entirety.     I confirm the note above accurately reflects all work, treatment, procedures, and medical  decision making performed by me.    Electronically signed by  Vinie Sill, MD on 03/22/2020 at 9:19 PM

## 2020-03-28 ENCOUNTER — Encounter

## 2020-03-28 ENCOUNTER — Ambulatory Visit: Admit: 2020-03-28 | Discharge: 2020-03-28 | Payer: PRIVATE HEALTH INSURANCE | Attending: Family | Primary: Family

## 2020-03-28 DIAGNOSIS — D509 Iron deficiency anemia, unspecified: Secondary | ICD-10-CM

## 2020-03-28 LAB — CBC WITH AUTO DIFFERENTIAL
Basophils %: 0.5 %
Basophils Absolute: 0 10*3/uL (ref 0.0–0.2)
Eosinophils %: 4.3 %
Eosinophils Absolute: 0.3 10*3/uL (ref 0.0–0.6)
Hematocrit: 28.7 % — ABNORMAL LOW (ref 36.0–48.0)
Hemoglobin: 8.7 g/dL — ABNORMAL LOW (ref 12.0–16.0)
Lymphocytes %: 27.4 %
Lymphocytes Absolute: 1.9 10*3/uL (ref 1.0–5.1)
MCH: 20.7 pg — ABNORMAL LOW (ref 26.0–34.0)
MCHC: 30.5 g/dL — ABNORMAL LOW (ref 31.0–36.0)
MCV: 67.8 fL — ABNORMAL LOW (ref 80.0–100.0)
MPV: 8.1 fL (ref 5.0–10.5)
Monocytes %: 4.9 %
Monocytes Absolute: 0.3 10*3/uL (ref 0.0–1.3)
Neutrophils %: 62.9 %
Neutrophils Absolute: 4.3 10*3/uL (ref 1.7–7.7)
Platelets: 421 10*3/uL (ref 135–450)
RBC: 4.23 M/uL (ref 4.00–5.20)
RDW: 22.2 % — ABNORMAL HIGH (ref 12.4–15.4)
WBC: 6.9 10*3/uL (ref 4.0–11.0)

## 2020-03-28 NOTE — Progress Notes (Signed)
SUBJECTIVE/OBJECTIVE:  HPI  Chief Complaint: Hypertension      Debra Griffin (DOB:  1980-08-25) is a 39 y.o. female here for f/u of HTN - at last appt she was started on amlodipine 5mg . She also had c/o DOE and chest pain. She was referred to cardiology - he increased amlodipine to 10mg . She has GXT and echo scheduled later this month. Lab workup at her initial appt also showed significant anemia with hemoglobin of 8.8 and hematocrit of 30.0 -further evaluation showed an iron of 12, iron sat of 4, ferritin of 6.8 and she was started on iron supplementation.    Today she reports that she is tolerating norvasc well. BP at home running 130/80s. Tolerating iron but does have constipation - taking colace prn. Stools since start iron are dark but previously did not have melena or hematochezia. Does report heavy menses, is not on birth control, has established at [facility]. Reports hx of IDA and needing to receive blood transfusions and iron transfusions after giving birth to her children. She does report more energy since starting iron. SOB is improving. Hasnt had chest pain. No lightheaded, dizziness, n/v.     Interested in wt management medication    Review of Systems   Constitutional: Negative.  Negative for chills and fever.   Respiratory: Positive for shortness of breath (improving).    Cardiovascular: Negative for chest pain.   Gastrointestinal: Positive for constipation. Negative for abdominal pain, blood in stool, nausea and vomiting.   Skin: Negative.  Negative for rash and wound.   Neurological: Negative.  Negative for dizziness, light-headedness and headaches.       Physical Exam  Vitals reviewed.   Constitutional:       General: She is not in acute distress.     Appearance: Normal appearance. She is well-developed. She is not ill-appearing, toxic-appearing or diaphoretic.   HENT:      Head: Normocephalic.      Right Ear: Hearing, tympanic membrane, ear canal and external ear normal.      Left  Ear: Hearing, tympanic membrane, ear canal and external ear normal.      Nose: Nose normal.      Mouth/Throat:      Lips: Pink.      Mouth: Mucous membranes are moist.      Pharynx: Oropharynx is clear. Uvula midline.   Eyes:      General: Lids are normal. Vision grossly intact.      Conjunctiva/sclera: Conjunctivae normal.      Pupils: Pupils are equal, round, and reactive to light.   Neck:      Vascular: No carotid bruit.      Trachea: Trachea and phonation normal.   Cardiovascular:      Rate and Rhythm: Normal rate and regular rhythm.      Pulses: Normal pulses.      Heart sounds: Normal heart sounds.   Pulmonary:      Effort: Pulmonary effort is normal. No accessory muscle usage or respiratory distress.      Breath sounds: Normal breath sounds and air entry. No decreased air movement or transmitted upper airway sounds. No decreased breath sounds, wheezing, rhonchi or rales.   Chest:      Chest wall: No tenderness.   Musculoskeletal:         General: No swelling. Normal range of motion.      Cervical back: Normal range of motion and neck supple.   Lymphadenopathy:  Cervical: No cervical adenopathy.   Skin:     General: Skin is warm and dry.      Capillary Refill: Capillary refill takes less than 2 seconds.   Neurological:      Mental Status: She is alert and oriented to person, place, and time. Mental status is at baseline.   Psychiatric:         Attention and Perception: Attention and perception normal.         Mood and Affect: Mood and affect normal.         Speech: Speech normal.         Behavior: Behavior normal. Behavior is cooperative.         Thought Content: Thought content normal.         Cognition and Memory: Cognition and memory normal.         Judgment: Judgment normal.         Vitals:    03/28/20 0729 03/28/20 0738   BP: 136/88 132/84   Site: Left Upper Arm    Position: Sitting    Cuff Size: Large Adult    Pulse: 69    Resp: 15    Temp: 97.2 ??F (36.2 ??C)    TempSrc: Temporal    SpO2: 98%     Weight: 290 lb (131.5 kg)    Height: 5\' 10"  (1.778 m)        There were no vitals filed for this visit.      ASSESSMENT/PLAN:  1. Iron deficiency anemia, unspecified iron deficiency anemia type  -     CBC Auto Differential; Future   Continue iron supplementation   Advised pt it can take up to 3 mos to notice significant change in blood counts but with how significant anemia was I will repeat cbc to ensure her blood counts have not dropped further   Encouraged her to use miralax daily for constipation, colace prn since it is causing cramping    2. Essential hypertension  -     [facility], [place]   Continue current tx   Discussed dash diet - will also refer to weight management    3. Class 3 severe obesity due to excess calories without serious comorbidity with body mass index (BMI) of 40.0 to 44.9 in adult [facility])  -     [facility], [place]   See above    Return in about 2 months (around 05/28/2020). OR sooner with questions, concerns, worsening symptoms  Patient verbalized understanding and agreement to plan.          Documentation was done using voice recognition dragon software. Every effort was made to ensure accuracy; however, inadvertent unintentional computerized transcription errors may be present.   An electronic signature was used to authenticate this note.      --07/26/2020, APRN - CNP

## 2020-03-28 NOTE — Patient Instructions (Signed)
Patient Education        DASH Diet: Care Instructions  Your Care Instructions     The DASH diet is an eating plan that can help lower your blood pressure. DASH stands for Dietary Approaches to Stop Hypertension. Hypertension is high blood pressure.  The DASH diet focuses on eating foods that are high in calcium, potassium, and magnesium. These nutrients can lower blood pressure. The foods that are highest in these nutrients are fruits, vegetables, low-fat dairy products, nuts, seeds, and legumes. But taking calcium, potassium, and magnesium supplements instead of eating foods that are high in those nutrients does not have the same effect. The DASH diet also includes whole grains, fish, and poultry.  The DASH diet is one of several lifestyle changes your doctor may recommend to lower your high blood pressure. Your doctor may also want you to decrease the amount of sodium in your diet. Lowering sodium while following the DASH diet can lower blood pressure even further than just the DASH diet alone.  Follow-up care is a key part of your treatment and safety. Be sure to make and go to all appointments, and call your doctor if you are having problems. It's also a good idea to know your test results and keep a list of the medicines you take.  How can you care for yourself at home?  Following the DASH diet  ?? Eat 4 to 5 servings of fruit each day. A serving is 1 medium-sized piece of fruit, ?? cup chopped or canned fruit, 1/4 cup dried fruit, or 4 ounces (?? cup) of fruit juice. Choose fruit more often than fruit juice.  ?? Eat 4 to 5 servings of vegetables each day. A serving is 1 cup of lettuce or raw leafy vegetables, ?? cup of chopped or cooked vegetables, or 4 ounces (?? cup) of vegetable juice. Choose vegetables more often than vegetable juice.  ?? Get 2 to 3 servings of low-fat and fat-free dairy each day. A serving is 8 ounces of milk, 1 cup of yogurt, or 1 ?? ounces of cheese.  ?? Eat 6 to 8 servings of grains each day.  A serving is 1 slice of bread, 1 ounce of dry cereal, or ?? cup of cooked rice, pasta, or cooked cereal. Try to choose whole-grain products as much as possible.  ?? Limit lean meat, poultry, and fish to 2 servings each day. A serving is 3 ounces, about the size of a deck of cards.  ?? Eat 4 to 5 servings of nuts, seeds, and legumes (cooked dried beans, lentils, and split peas) each week. A serving is 1/3 cup of nuts, 2 tablespoons of seeds, or ?? cup of cooked beans or peas.  ?? Limit fats and oils to 2 to 3 servings each day. A serving is 1 teaspoon of vegetable oil or 2 tablespoons of salad dressing.  ?? Limit sweets and added sugars to 5 servings or less a week. A serving is 1 tablespoon jelly or jam, ?? cup sorbet, or 1 cup of lemonade.  ?? Eat less than 2,300 milligrams (mg) of sodium a day. If you limit your sodium to 1,500 mg a day, you can lower your blood pressure even more.  ?? Be aware that all of these are the suggested number of servings for people who eat 1,800 to 2,000 calories a day. Your recommended number of servings may be different if you need more or fewer calories.  Tips for success  ?? Start small.   Do not try to make dramatic changes to your diet all at once. You might feel that you are missing out on your favorite foods and then be more likely to not follow the plan. Make small changes, and stick with them. Once those changes become habit, add a few more changes.  ?? Try some of the following:  ? Make it a goal to eat a fruit or vegetable at every meal and at snacks. This will make it easy to get the recommended amount of fruits and vegetables each day.  ? Try yogurt topped with fruit and nuts for a snack or healthy dessert.  ? Add lettuce, tomato, cucumber, and onion to sandwiches.  ? Combine a ready-made pizza crust with low-fat mozzarella cheese and lots of vegetable toppings. Try using tomatoes, squash, spinach, broccoli, carrots, cauliflower, and onions.  ? Have a variety of cut-up vegetables with  a low-fat dip as an appetizer instead of chips and dip.  ? Sprinkle sunflower seeds or chopped almonds over salads. Or try adding chopped walnuts or almonds to cooked vegetables.  ? Try some vegetarian meals using beans and peas. Add garbanzo or kidney beans to salads. Make burritos and tacos with mashed pinto beans or black beans.  Where can you learn more?  Go to [email or web address] and sign in to your MyChart account. Enter H967 in the Search Health Information box to learn more about "DASH Diet: Care Instructions."     If you do not have an account, please click on the "Sign Up Now" link.  Current as of: September 13, 2019??????????????????????????????Content Version: 13.0  ?? 2006-2021 [employer].   Care instructions adapted under license by [facility]. If you have questions about a medical condition or this instruction, always ask your healthcare professional. [employer] disclaims any warranty or liability for your use of this information.

## 2020-04-09 ENCOUNTER — Ambulatory Visit: Payer: PRIVATE HEALTH INSURANCE | Primary: Family

## 2020-04-09 ENCOUNTER — Inpatient Hospital Stay: Payer: PRIVATE HEALTH INSURANCE | Primary: Family

## 2020-04-19 ENCOUNTER — Encounter

## 2020-04-21 NOTE — Telephone Encounter (Signed)
Duplicate request

## 2020-05-02 ENCOUNTER — Ambulatory Visit: Admit: 2020-05-02 | Discharge: 2020-05-02 | Payer: PRIVATE HEALTH INSURANCE | Attending: Family | Primary: Family

## 2020-05-02 DIAGNOSIS — F411 Generalized anxiety disorder: Secondary | ICD-10-CM

## 2020-05-02 MED ORDER — BUSPIRONE HCL 5 MG PO TABS
5 MG | ORAL_TABLET | Freq: Three times a day (TID) | ORAL | 0 refills | Status: DC | PRN
Start: 2020-05-02 — End: 2020-05-23

## 2020-05-02 MED ORDER — ESCITALOPRAM OXALATE 10 MG PO TABS
10 MG | ORAL_TABLET | Freq: Every day | ORAL | 1 refills | Status: DC
Start: 2020-05-02 — End: 2020-05-20

## 2020-05-02 NOTE — Patient Instructions (Signed)
Patient Education        Learning About Anxiety Disorders  What are anxiety disorders?     Anxiety disorders are a type of medical problem. They cause severe anxiety. When you feel anxious, you feel that something bad is about to happen. This feeling interferes with your life.  These disorders include:  ?? Generalized anxiety disorder. You feel worried and stressed about many everyday events and activities. This goes on for several months and disrupts your life on most days.  ?? Panic disorder. You have repeated panic attacks. A panic attack is a sudden, intense fear or anxiety. It may make you feel short of breath. Your heart may pound.  ?? Social anxiety disorder. You feel very anxious about what you will say or do in front of people. For example, you may be scared to talk or eat in public. This problem affects your daily life.  ?? Phobias. You are very scared of a specific object, situation, or activity. For example, you may fear spiders, high places, or small spaces.  What are the symptoms?  Generalized anxiety disorder  Symptoms may include:  ?? Feeling worried and stressed about many things almost every day.  ?? Feeling tired or irritable. You may have a hard time concentrating.  ?? Having headaches or muscle aches.  ?? Having a hard time getting to sleep or staying asleep.  Panic disorder  You may have repeated panic attacks when there is no reason for feeling afraid. You may change your daily activities because you worry that you will have another attack.  Symptoms may include:  ?? Intense fear, terror, or anxiety.  ?? Trouble breathing or very fast breathing.  ?? Chest pain or tightness.  ?? A heartbeat that races or is not regular.  Social anxiety disorder  Symptoms may include:  ?? Fear about a social situation, such as eating in front of others or speaking in public. You may worry a lot. Or you may be afraid that something bad will happen.  ?? Anxiety that can cause you to blush, sweat, and feel shaky.  ?? A  heartbeat that is faster than normal.  ?? A hard time focusing.  Phobias  Symptoms may include:  ?? More fear than most people of being around an object, being in a situation, or doing an activity. You might also be stressed about the chance of being around the thing you fear.  ?? Worry about losing control, panicking, fainting, or having physical symptoms like a faster heartbeat when you are around the situation or object.  How are these disorders treated?  Anxiety disorders can be treated with medicines or counseling. A combination of both may be used.  Medicines may include:  ?? Antidepressants. These may help your symptoms by keeping chemicals in your brain in balance.  ?? Benzodiazepines. These may give you short-term relief of your symptoms.  Some people use cognitive-behavioral therapy. A therapist helps you learn to change stressful or bad thoughts into helpful thoughts.  Lead a healthy lifestyle  A healthy lifestyle may help you feel better.  ?? Get at least 30 minutes of exercise on most days of the week. Walking is a good choice.  ?? Eat a healthy diet. Include fruits, vegetables, lean proteins, and whole grains in your diet each day.  ?? Try to go to bed at the same time every night. Try for 8 hours of sleep a night.  ?? Find ways to manage stress. Try relaxation exercises.  ??   Avoid alcohol and illegal drugs.  Follow-up care is a key part of your treatment and safety. Be sure to make and go to all appointments, and call your doctor if you are having problems. It's also a good idea to know your test results and keep a list of the medicines you take.  Where can you learn more?  Go to [email or web address] and sign in to your MyChart account. Enter K667 in the Search Health Information box to learn more about "Learning About Anxiety Disorders."     If you do not have an account, please click on the "Sign Up Now" link.  Current as of: October 31, 2019??????????????????????????????Content Version: 13.0  ?? 2006-2021  [employer].   Care instructions adapted under license by [facility]. If you have questions about a medical condition or this instruction, always ask your healthcare professional. [employer] disclaims any warranty or liability for your use of this information.

## 2020-05-02 NOTE — Progress Notes (Signed)
SUBJECTIVE/OBJECTIVE:  HPI  Chief Complaint: Mental Health Problem (Patient is here for panic attacks, anxiety and light headedness. )      Debra Griffin (DOB:  Sep 13, 1980) is a 39 y.o. female here for concerns of anxiety and panic attacks - reports more stress and anxiety in the last 6-8 wks - mom is in poor health, stress at a new job and recently moving to this area. More emotional and crying a lot, difficulty concentrating, worrying, not sleeping well (mind is racing - ruminates), lacking motivation, dysphoric mood. Having panic attacks. Denies si/hi. Reports shes also have some physical manifestations of anxiety - daily headaches - back, neck, shoulder muscles feel tight and L arm hurts when she wakes up - sleeps on L arm. She reports some intermittent lightheadedness - 2-3x/wk - the examples she described mostly occurred w/ position changes - last for 20-30 mins. Had severe iron def anemia recently - last hgb 10.2 when she saw her hematologist 2 wks ago which is improved. Was previously evaluated for DOE - reports this is improving. Has echo and GXT scheduled. No chest pain, palpitations, blood in stool, n/v, abdominal pain. Has never had a hx of anxiety.   LMP 12/3    Review of Systems   Constitutional: Negative.  Negative for chills and fever.   HENT: Negative.  Negative for congestion and sore throat.    Respiratory: Positive for shortness of breath (improving). Negative for chest tightness.    Cardiovascular: Negative.  Negative for chest pain and palpitations.   Gastrointestinal: Negative.  Negative for abdominal pain, blood in stool, nausea and vomiting.   Genitourinary: Negative.    Musculoskeletal: Positive for arthralgias and myalgias.   Skin: Negative.  Negative for rash and wound.   Neurological: Positive for light-headedness and headaches. Negative for dizziness.       Physical Exam  Vitals reviewed.   Constitutional:       General: She is not in acute distress.     Appearance: Normal  appearance. She is well-developed. She is not ill-appearing, toxic-appearing or diaphoretic.   HENT:      Head: Normocephalic.      Right Ear: Hearing, tympanic membrane, ear canal and external ear normal.      Left Ear: Hearing, tympanic membrane, ear canal and external ear normal.      Nose: Nose normal.      Mouth/Throat:      Lips: Pink.      Mouth: Mucous membranes are moist.      Pharynx: Oropharynx is clear. Uvula midline.   Eyes:      General: Lids are normal. Vision grossly intact.      Conjunctiva/sclera: Conjunctivae normal.      Pupils: Pupils are equal, round, and reactive to light.   Neck:      Trachea: Trachea and phonation normal.   Cardiovascular:      Rate and Rhythm: Normal rate and regular rhythm.      Pulses: Normal pulses.      Heart sounds: Normal heart sounds.   Pulmonary:      Effort: Pulmonary effort is normal. No accessory muscle usage or respiratory distress.      Breath sounds: Normal breath sounds and air entry. No decreased air movement or transmitted upper airway sounds. No decreased breath sounds, wheezing, rhonchi or rales.   Chest:      Chest wall: No tenderness.   Abdominal:      General: Bowel sounds are normal. There  is no distension.      Palpations: Abdomen is soft. There is no mass.      Tenderness: There is no abdominal tenderness. There is no guarding or rebound.   Musculoskeletal:         General: No swelling. Normal range of motion.      Cervical back: Normal range of motion and neck supple.   Lymphadenopathy:      Cervical: No cervical adenopathy.   Skin:     General: Skin is warm and dry.      Capillary Refill: Capillary refill takes less than 2 seconds.   Neurological:      Mental Status: She is alert and oriented to person, place, and time.   Psychiatric:         Attention and Perception: Attention and perception normal.         Mood and Affect: Mood and affect normal.         Speech: Speech normal.         Behavior: Behavior normal. Behavior is cooperative.          Thought Content: Thought content normal.         Cognition and Memory: Cognition and memory normal.         Judgment: Judgment normal.         Vitals:    05/02/20 1052   BP: 126/62   Site: Left Upper Arm   Position: Sitting   Cuff Size: Large Adult   Pulse: 67   Resp: 16   Temp: 97.1 ??F (36.2 ??C)   SpO2: 98%   Weight: 292 lb 12.8 oz (132.8 kg)   Height: 5\' 10"  (1.778 m)       There were no vitals filed for this visit.      ASSESSMENT/PLAN:  1. GAD (generalized anxiety disorder)  -     escitalopram (LEXAPRO) 10 MG tablet; Take 1 tablet by mouth daily, Disp-30 tablet, R-1Normal  -     busPIRone (BUSPAR) 5 MG tablet; Take 1 tablet by mouth 3 times daily as needed (anxiety), Disp-60 tablet, R-0Normal  -     Ambulatory referral to Psychology   Discussed tx options - r/b/se. Start lexapro. buspar prn. Refer to Dr . Fu in 1 mo    2. Musculoskeletal neck pain   Pt having frequent headaches and shoulder/upper back/neck pain since anxiety/stress recently - discussed this may be d/t being tense - will re-evaluate at next appt and further evaluate if persistent despite improving mental health.    3. Lightheadedness   Likely multifactorial - iron def anemia, HTN, stress but DOE is improved and no CP. Happens 2-3x/ wk. discussed increasing fluid intake and changing positions slowly. Will re-evaluate at next appt if persistent.     4. Essential hypertension   Well controlled on current tx    Return in about 1 month (around 06/02/2020) for mental health. OR sooner with questions, concerns, worsening symptoms  Patient verbalized understanding and agreement to plan.          Documentation was done using voice recognition dragon software. Every effort was made to ensure accuracy; however, inadvertent unintentional computerized transcription errors may be present.   An electronic signature was used to authenticate this note.      --06/04/2020, APRN - CNP

## 2020-05-08 ENCOUNTER — Encounter

## 2020-05-12 ENCOUNTER — Encounter: Primary: Family

## 2020-05-12 MED ORDER — AMLODIPINE BESYLATE 10 MG PO TABS
10 MG | ORAL_TABLET | Freq: Every day | ORAL | 3 refills | Status: DC
Start: 2020-05-12 — End: 2021-07-27

## 2020-05-12 NOTE — Telephone Encounter (Signed)
Last OV: 03/18/20  Next OV: PRN  Last refill:03/18/20  Most recent Labs: 02/22/20  Last EKG (if needed):02/22/20

## 2020-05-14 ENCOUNTER — Encounter: Attending: Family Medicine | Primary: Family

## 2020-05-20 ENCOUNTER — Ambulatory Visit
Admit: 2020-05-20 | Discharge: 2020-05-20 | Payer: PRIVATE HEALTH INSURANCE | Attending: Nurse Practitioner | Primary: Family

## 2020-05-20 DIAGNOSIS — F410 Panic disorder [episodic paroxysmal anxiety] without agoraphobia: Secondary | ICD-10-CM

## 2020-05-20 MED ORDER — ESCITALOPRAM OXALATE 20 MG PO TABS
20 MG | ORAL_TABLET | Freq: Every day | ORAL | 1 refills | Status: DC
Start: 2020-05-20 — End: 2020-06-12

## 2020-05-20 MED ORDER — HYDROXYZINE HCL 25 MG PO TABS
25 MG | ORAL_TABLET | Freq: Three times a day (TID) | ORAL | 0 refills | Status: AC | PRN
Start: 2020-05-20 — End: 2020-05-30

## 2020-05-20 NOTE — Progress Notes (Signed)
Department of Internal Medicine  Clinic Note    Date: 05/20/2020    Subjective/Objective:     Chief Complaint   Patient presents with   ??? Panic Attack     right before Christmas this got worse. Patient said that work is stressful        HPI   This is a 40 year old female patient of Debra Billow, APRN. Pt presents today for evaluation of panic disorder.      05/02/2020: Patient saw her PCP and reported more stress and anxiety over the prior 6 to 8 weeks.  Mother in poor health, job stress, recently moved.  Started on Lexapro 10 mg, BuSpar as needed, was referred to Dr. Frances Maywood.    Today patient is complaining of nausea, lightheadedness, headaches, hair loss, overeating initially- now decreased appetite has not established care with psychology at this time.     Crying a lot- crying through appt today.  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   Sts her symptoms of panic and anxiety started a couple months ago- no h/o this. sts a lot of work stress started after she filed a report with her Audiological scientist at work and now feels like she's being retaliated against and targeted at work. She has since filed an "equal opportunity complaint.".   Easily angered, lacking motivation.   Sts she feels like she's at a breaking point and fleeting thoughts that she would rather not be alive and her family would be better off without her.        Tobacco:  reports that she has never smoked. She has never used smokeless tobacco.  ETOH:  reports previous alcohol use.    Current Outpatient Medications   Medication Sig Dispense Refill   ??? escitalopram (LEXAPRO) 20 MG tablet Take 1 tablet by mouth daily 30 tablet 1   ??? hydrOXYzine (ATARAX) 25 MG tablet Take 1 tablet by mouth every 8 hours as needed for Anxiety 30 tablet 0   ??? amLODIPine (NORVASC) 10 MG tablet Take 1 tablet by mouth daily 90 tablet 3   ??? ferrous sulfate (IRON 325) 325 (65  Fe) MG tablet Take 1 tablet by mouth 2 times daily 180 tablet 1     No current facility-administered medications for this visit.       No Known Allergies    Review of Systems   Constitutional: Positive for appetite change. Negative for chills, fatigue and fever.   Respiratory: Negative for cough and shortness of breath.    Cardiovascular: Negative for chest pain.   Gastrointestinal: Positive for nausea. Negative for abdominal pain, diarrhea and vomiting.   Endocrine:        Hair loss   Genitourinary: Negative for dysuria.   Musculoskeletal: Negative for myalgias.   Skin: Negative for rash.   Neurological: Positive for light-headedness and headaches. Negative for dizziness, weakness and numbness.   Psychiatric/Behavioral: Positive for decreased concentration and dysphoric mood. Negative for suicidal ideas.   All other systems reviewed and are negative.      Vitals:  BP (!) 150/86 (Site: Left Lower Arm, Position: Sitting, Cuff Size: Large Adult)    Pulse 71    Temp 97.8 ??F (36.6 ??C)    Resp 14    Ht 5\' 10"  (1.778 m)    Wt 297 lb (134.7 kg)    SpO2 98%    BMI 42.62  kg/m??     Physical Exam  Vitals and nursing note reviewed.   Constitutional:       General: She is not in acute distress.     Appearance: She is well-developed. She is not diaphoretic.   HENT:      Head: Normocephalic and atraumatic.   Eyes:      Conjunctiva/sclera: Conjunctivae normal.      Pupils: Pupils are equal, round, and reactive to light.   Neck:      Thyroid: No thyroid mass or thyromegaly.   Cardiovascular:      Rate and Rhythm: Normal rate and regular rhythm.      Heart sounds: Normal heart sounds. No murmur heard.  No friction rub. No gallop.    Pulmonary:      Effort: Pulmonary effort is normal. No respiratory distress.      Breath sounds: Normal breath sounds. No wheezing or rales.   Musculoskeletal:         General: Normal range of motion.      Cervical back: Normal range of motion and neck supple.   Skin:     General: Skin is warm and dry.       Findings: No rash.   Neurological:      Mental Status: She is alert and oriented to person, place, and time.   Psychiatric:         Attention and Perception: Attention normal.         Mood and Affect: Mood is anxious. Affect is tearful.         Speech: Speech is rapid and pressured.         Behavior: Behavior normal.         Thought Content: Thought content normal. Thought content does not include homicidal or suicidal ideation. Thought content does not include homicidal or suicidal plan.         Cognition and Memory: Cognition and memory normal.         Judgment: Judgment normal.         Assessment/Plan     Debra Griffin was seen today for panic attack.    Diagnoses and all orders for this visit:    Panic attacks  -     escitalopram (LEXAPRO) 20 MG tablet; Take 1 tablet by mouth daily  -     hydrOXYzine (ATARAX) 25 MG tablet; Take 1 tablet by mouth every 8 hours as needed for Anxiety  - stopped buspar d/t ineffective  -increase lexapro   I've explained to her that drugs of the SSRI class can have side effects such as weight gain, sexual dysfunction, insomnia, headache, nausea. These medications are generally effective at alleviating symptoms of anxiety and/or depression. Let me know if significant side effects do occur.  -Encourage patient to follow-up with Dr. Hulda Humphrey, psychology as referred  -Spoke extensively with patient about giving her a note for several days off work.  Patient is requesting a FMLA leave starting on Monday.    GAD (generalized anxiety disorder)  -     escitalopram (LEXAPRO) 20 MG tablet; Take 1 tablet by mouth daily  -     hydrOXYzine (ATARAX) 25 MG tablet; Take 1 tablet by mouth every 8 hours as needed for Anxiety        Past, Family, and Social history reviewed with patient during this visit.      RX management: see under diagnosis section    No orders of the defined types were placed in this  encounter.      Return for f/u with pcp in 2 weeks for re-eval.    Documentation was done using voice  recognition dragon software.  Every effort was made to ensure accuracy; however, inadvertent unintentional computerized transcription errors may be present.       Legrand Rams, APRN - CNP, FNP-C     05/23/2020  1:43 PM

## 2020-05-20 NOTE — Telephone Encounter (Signed)
Patient is on your schedule this afternoon

## 2020-05-20 NOTE — Telephone Encounter (Signed)
-----   Message from Vernia Buff sent at 05/20/2020 12:04 PM EST -----  Subject: Appointment Request    Reason for Call: Routine Mental Health    QUESTIONS  Type of Appointment? Established Patient  Reason for appointment request? Other - provider out with covid   Additional Information for Provider? Pt would like to see another provider   for her medication since her current provider is out with covid   ---------------------------------------------------------------------------  --------------  CALL BACK INFO  What is the best way for the office to contact you? OK to leave message on   voicemail  Preferred Call Back Phone Number? [phone]  ---------------------------------------------------------------------------  --------------  SCRIPT ANSWERS  Relationship to Patient? Self  Are you having thoughts of hurting yourself or others? No  Is this a follow up of prior diagnosis? Yes   Have you been diagnosed with, awaiting test results for, or told that you   are suspected of having COVID-19 (Coronavirus)? (If patient has tested   negative or was tested as a requirement for work, school, or travel and   not based on symptoms, answer no)? No  Within the past two weeks have you developed any of the following symptoms   (answer ???no??? if symptoms have been present longer than 2 weeks or began   more than 2 weeks ago)? Fever or Chills, Cough, Shortness of breath or   difficulty breathing, Loss of taste or smell, Sore throat, Nasal   congestion, Sneezing or runny nose, Fatigue or generalized body aches   (answer no if pain is specific to a body part e.g. back pain), Diarrhea,   Headache? No  Have you had close contact with someone with COVID-19 in the last 14 days?   No  (Service Expert ??? click yes below to proceed with [employer] As Usual   Scheduling)? Yes

## 2020-05-21 ENCOUNTER — Ambulatory Visit: Payer: PRIVATE HEALTH INSURANCE | Primary: Family

## 2020-05-21 ENCOUNTER — Encounter: Payer: PRIVATE HEALTH INSURANCE | Attending: Family | Primary: Family

## 2020-05-21 ENCOUNTER — Inpatient Hospital Stay: Payer: PRIVATE HEALTH INSURANCE | Primary: Family

## 2020-05-21 NOTE — Telephone Encounter (Signed)
Patient does not want to start process over paperwork needs to just stay with 05/26/20  For start leave date.   Debra Griffin ph #[phone]  Patient sending same information in my chart message.

## 2020-05-21 NOTE — Telephone Encounter (Signed)
Michele is handling

## 2020-05-21 NOTE — Telephone Encounter (Signed)
It looks like mary saw patient yesterday - did you discuss leave of absence papers?

## 2020-05-21 NOTE — Telephone Encounter (Signed)
Please advise.

## 2020-05-21 NOTE — Telephone Encounter (Signed)
Addressed.

## 2020-05-22 ENCOUNTER — Encounter: Attending: Nurse Practitioner | Primary: Family

## 2020-05-26 ENCOUNTER — Ambulatory Visit: Admit: 2020-05-26 | Discharge: 2020-05-26 | Payer: PRIVATE HEALTH INSURANCE | Attending: Psychologist | Primary: Family

## 2020-05-26 DIAGNOSIS — F430 Acute stress reaction: Secondary | ICD-10-CM

## 2020-05-26 NOTE — Patient Instructions (Signed)
The Behavioral Health (BH) Consultant giving you this message provides team-based care to treat the mind and body. He works directly with your doctor, who will always stay in charge of your care. Most BH visits are 30 minutes or less. Usually, the BH provider and your doctor continue to see you until you are starting to do better and have a good plan in place for continued progress. Once that happens, most patients follow-up with just their doctor (though the BH provider remains available to you as needed). If you are not improving, or if you desire outside mental health treatment, or if your doctor recommends more specialized help, we will be happy to help you find a mental health specialist in the community. Please also note your health insurance may be billed for BH visits. Check with your insurance company, and tell the BH provider, if you have any questions about your BH coverage.    Diaphragmatic Breathing Exercises    Exercise 1:  The Stimulating Breath (also called the Bellows Breath)  This exercise aims to raise energy level and increase alertness.    - Inhale and exhale rapidly through your nose, keeping your mouth closed but relaxed. Your breaths in and out should be equal in duration, but as short as possible. This is a noisy breathing exercise.  - Try for three in-and-out breath cycles per second. This produces a quick movement of the diaphragm, suggesting a bellows. Breathe normally after each cycle.  - Do not do for more than 15 seconds on your first try. Each time you practice the Stimulating Breath, you can increase your time by five seconds or so, until you reach a full minute.    If done properly, you may feel invigorated, comparable to the heightened awareness you feel after a good workout. You should feel the effort at the back of the neck, the diaphragm, the chest and the abdomen. Try this breathing exercise the next time you need an energy boost and feel yourself reaching for a cup of  coffee.        Exercise 2:  The 4-7-8 (or Relaxing Breath) Exercise  This exercise is utterly simple, takes almost no time, requires no equipment and can be done anywhere. Although you can do the exercise in any position, sit with your back straight while learning the exercise. Place the tip of your tongue against the ridge of tissue just behind your upper front teeth, and keep it there through the entire exercise. You will be exhaling through your mouth around your tongue; try pursing your lips slightly if this seems awkward.    ??? Exhale completely through your mouth, making a whoosh sound.  ??? Close your mouth and inhale quietly through your nose to a mental count of four.  ??? Hold your breath for a count of seven.  ??? Exhale completely through your mouth, making a whoosh sound to a count of eight.  ??? This is one breath. Now inhale again and repeat the cycle three more times for a total of four breaths.    Note that you always inhale quietly through your nose and exhale audibly through your mouth. The tip of your tongue stays in position the whole time. Exhalation takes twice as long as inhalation. The absolute time you spend on each phase is not important; the ratio of 4:7:8 is important. If you have trouble holding your breath, speed the exercise up but keep to the ratio of 4:7:8 for the three phases. With practice   you can slow it all down and get used to inhaling and exhaling more and more deeply.    This exercise is a natural tranquilizer for the nervous system. Unlike tranquilizing drugs, which are often effective when you first take them but then lose their power over time, this exercise is subtle when you first try it but gains in power with repetition and practice. Do it at least twice a day. You cannot do it too frequently. Do NOT do more than 4 breaths at one time for the first month of practice. Later, if you wish, you can extend it to eight breaths. If you feel a little lightheaded when you first breathe  this way, do not be concerned; it will pass.    Once you develop this technique by practicing it every day, it will be a very useful tool that you will always have with you. Use it whenever anything upsetting happens - before you react. Use it whenever you are aware of internal tension. Use it to help you fall asleep. This exercise cannot be recommended too highly. Everyone can benefit from it.        Exercise 3:   Meditation Exercise: Breath Counting  If you want to get a feel for this challenging work, try your hand at breath counting, a deceptively simple technique.    Sit in a comfortable position with the spine straight and head inclined slightly forward. Gently close your eyes and take a few deep breaths. Then let the breath come naturally without trying to influence it. Ideally it will be quiet and slow, but depth and rhythm may vary.    ??? To begin the exercise, count "one" to yourself as you exhale.  ??? The next time you exhale, count "two," and so on up to "five."  ??? Then begin a new cycle, counting "one" on the next exhalation.    Never count higher than "five," and count only when you exhale. You will know your attention has wandered when you find yourself up to "eight," "12," even "19."  Try to do 10 minutes of this form of meditation.      Personal Thought  Control.  Our thinking often creates anxiety for us.  Getting better control of our thinking can go a long way in helping us cope.  The following steps can be useful.   1. Let yourself become aware of thoughts you have when you are anxious.  What are the words that you are saying to yourself at that moment?  Sometimes it takes a little practice before we become aware of our thoughts.  Some examples might be:  ???I know something bad is going to happen,??? or This is horrible??? or ???Why is this happening to me!????  2. Write your thoughts down.  It???s much easier to work with our thoughts, analyze them, and replace them if they are ???in black and white.???  3. Ask  yourself the following questions about your thoughts:  a. Is it true?  (Is it logically correct?  Where is the evidence to support the truth of that thought?  Are there alternative ways of thinking that would be more correct?).  If a thought is not as true as it could be, replace it with a more realistic and helpful one.  The majority of thoughts we have that generate anxiety are not the most realistic appraisals of the situation.   b. So what?  (If this is logically correct, what does it mean to   me?  Is there anything I can do about the situation?  Is it in my best interest to get anxious about this?).   4. Use coping self-statements.  When feeling anxious, you may be able to tell yourself automatic phrases without thinking too much about it.  A couple of examples would be phrases such as ???It???s OK, I can handle it,??? or ???I???ve been through things like this before and have done all right.???  Notice that these statements tend to be true for all of us.   5. Notice a change in your emotional state as you change your thinking.  As your thoughts become more realistic, you will probably notice a decrease in anxiety and tension, and an increase in your ability to cope.

## 2020-05-26 NOTE — Progress Notes (Signed)
Behavioral Health Consultation  [name], Ph.D.  Psychologist  05/26/2020  4:30 PM EST      Time spent with Patient: 30 minutes  This is patient's first [facility] appointment.    Reason for Consult: acute stress, panic disorder  Referring Provider: Amie Portland, APRN - CNP  [address]  [place] [id number]    Feedback for PCP:     Pt provided informed consent for the behavioral health program. Discussed with patient model of service to include the limits of confidentiality (i.e. abuse reporting, suicide intervention, etc.) and short-term intervention focused approach.  Pt indicated understanding.  Feedback given to PCP.    S:  Patient presents with acute crisis, reports panic attacks nausea, anxiety, and feeling overwhelmed. She says that she works 70+ hours and that her employer [employer] ?) has unrealistic expectations of performance and ignores work/life balance. She said her mother who raised 6 children as a single mother in [place] is very ill and that she feels guilty that she's not there and doing more for her. Her husband is Occupational hygienist and is currently unemployed which causes the patient confusion. They have an 65-year-old daughter who is dx with ASD, and sounds more autistic than Asperger's.Her daughter has an IEP but the patient still worries about her. [employer] "moved" her family to [place] from [place], but she has no support in this area. She was tearful, clearly absolutely overwhelmed and has worked all her life so the current overwhelmedness feels "selfish and like a failure."         Social History     Tobacco Use   ??? Smoking status: Never Smoker   ??? Smokeless tobacco: Never Used   Substance Use Topics   ??? Alcohol use: Not Currently        Illicit drugs:   Social History     Substance and Sexual Activity   Drug Use Never        O:  MSE:  Appearance: good hygiene   Attitude: cooperative and in great  distress  Consciousness: alert  Orientation: oriented to person, place, time, general  circumstance  Memory: recent and remote memory intact  Attention/Concentration: variable  Psychomotor Activity: psychomotor retardation  Eye Contact: not good quality  Speech: low volume and slow  Mood: very tearful, overwhelmed, helpless, hopeless  Affect: flat  Perception: within normal limits  Thought Content: within normal limits  Thought Process: logical, coherent  Insight: good  Judgment: intact  Ability to understand instructions: Yes  Ability to respond meaningfully: Yes  Morbid Ideation: passive thoughts of death  Suicide Assessment: suicidal ideation without plan or intent  Homicidal Ideation: no    A:  Creating behavioral change plan in her current crisis state. She is going to pursue FMLA despite feeling like this is a failure on her part. It was shared with her that that thinking was not "right thinking" which she recognized and kinda laughed at. Talked about a plan to first achieve her FMLA so she can have some space to make the decisions that are important and for which she has insufficient emotional resources for. We talked about grounding and stress management techniques and she was given handouts on these ideas. We are still creating her BCP.     No flowsheet data found.  Interpretation of GAD-7 score: 5-9 = mild anxiety, 10-14 = moderate anxiety, 15+ = severe anxiety. Recommend referral to behavioral health for scores 10 or greater.    PHQ Scores 02/22/2020  PHQ2 Score 2   PHQ9 Score 2     Interpretation of Total Score Depression Severity: 1-4 = Minimal depression, 5-9 = Mild depression, 10-14 = Moderate depression, 15-19 = Moderately severe depression, 20-27 = Severe depression    Diagnosis:    1. Acute stress disorder        There is no problem list on file for this patient.        Plan:  Pt interventions:  Established rapport, Agenda-setting to identify pt's primary goals for [facility] visit / overall health, Supportive techniques, Provided Psychoeducation re: managing crisis stress, Emphasized  self-care as important for managing overall health and Provided handout on deep breathing and managing thinking errors.       Pt Behavioral Change Plan:  Pt set the following goals:  Pt scheduled F/U visit in one week  See section 'A'

## 2020-05-29 ENCOUNTER — Encounter: Attending: Family | Primary: Family

## 2020-05-29 NOTE — Progress Notes (Deleted)
SUBJECTIVE/OBJECTIVE:  HPI  Chief Complaint: No chief complaint on file.      Debra Griffin (DOB:  [birth year]) is a 40 y.o. female here for fu of anxiety - initially seen by me on 05/02/20 and started on lexapro - she has had a few appts with psychology Dr Hulda Humphrey - presented to see Zane Herald NP 05/20/20 for worsening panic attacks. lexapro was increased to 20mg  and started on hydroxyzine. She was also given fmla paperwork to be off work.         Review of Systems    Physical Exam    There were no vitals filed for this visit.    There were no vitals filed for this visit.      ASSESSMENT/PLAN:  {There are no diagnoses linked to this encounter. (Refresh or delete this SmartLink)}      No follow-ups on file. OR sooner with questions, concerns, worsening symptoms  Patient verbalized understanding and agreement to plan.    {Time Documentation Optional:[id number]}      Documentation was done using voice recognition dragon software. Every effort was made to ensure accuracy; however, inadvertent unintentional computerized transcription errors may be present.   An electronic signature was used to authenticate this note.      -- , APRN - CNP

## 2020-05-30 ENCOUNTER — Encounter

## 2020-06-02 ENCOUNTER — Ambulatory Visit: Admit: 2020-06-02 | Discharge: 2020-06-02 | Payer: PRIVATE HEALTH INSURANCE | Attending: Psychologist | Primary: Family

## 2020-06-02 DIAGNOSIS — F430 Acute stress reaction: Secondary | ICD-10-CM

## 2020-06-02 NOTE — Progress Notes (Signed)
Behavioral Health Consultation  [name], Ph.D.  Psychologist  06/02/2020  4:30 PM EST      Time spent with Patient: 30 minutes  This is patient's second [facility] appointment.    Reason for Consult: acute stress, panic disorder  Referring Provider: Amie Portland, APRN - CNP  [address]  [facility] [id number]    Feedback for PCP:     Pt provided informed consent for the behavioral health program. Discussed with patient model of service to include the limits of confidentiality (i.e. abuse reporting, suicide intervention, etc.) and short-term intervention focused approach.  Pt indicated understanding.  Feedback given to PCP.        TELEHEALTH VISIT -- Audio Only (During COVID-19 public health emergency)  }  Pursuant to the emergency declaration under the [employer] and the [employer], 1135 waiver authority and the [employer] Act, this phone call was conducted, with patient's consent, to reduce the patient's risk of exposure to COVID-19 and provide continuity of care for an established patient. Services were provided through a phone call discussion to substitute for in-person clinic visit. Pt gave verbal informed consent to participate in telehealth services.     Consent:  She and/or health care decision maker is aware that that she may receive a bill for this telephone service, depending on her insurance coverage, and has provided verbal consent to proceed: Yes    Conducted a risk-benefit analysis and determined that the patient's presenting problems are consistent with the use of telepsychology. Determined that the patient has sufficient knowledge and skills in the use of technology enabling them to adequately benefit from telepsychology. It was determined that this patient was able to be properly treated without an in-person session. Patient verified that they were currently located at the [place] address that was provided during  registration.    Verified the following information:  Patient's identification: Yes  Patient location: [address]  [place] [address]  Patient's call back number: [phone]   Patient's emergency contact's name and number, as well as permission to contact them if needed: Extended Emergency Contact Information  Primary Emergency Contact: Nakima, Fluegge  Home Phone: [phone]  Relation: Spouse  Preferred language: English  Interpreter needed? No     Provider location: [place], [place]  Patient location; [place], [place]    I affirm this is an episode with a patient who has not had a related appointment within my department in the past 7 days or scheduled within the next 24 hours.   S:  Patient reports "no change" in her feelings of stress and feeling overwhelmed. She says that she is not going to work however, and that the [employer] paperwork is in process. It was confirmed that she would forward the paperwork here for documentation. She says that her mother's condition in [place] is unchanged, and that she had spoken to her sisters that morning and that they're trying to get her mother to her dialysis. She said that her 8-year-daughter who is diagnosed with ASD is unchanged and that her workers are trying to help her with self-soothing techniques. The patient sounds very tired, dysphoric, and flat.         Social History     Tobacco Use   ??? Smoking status: Never Smoker   ??? Smokeless tobacco: Never Used   Substance Use Topics   ??? Alcohol use: Not Currently        Illicit drugs:   Social  History     Substance and Sexual Activity   Drug Use Never        O:  MSE:  Appearance: good hygiene   Attitude: cooperative and in great  distress  Consciousness: alert  Orientation: oriented to person, place, time, general circumstance  Memory: recent and remote memory intact  Attention/Concentration: variable  Psychomotor Activity: psychomotor retardation  Eye Contact: not good quality  Speech: low volume and slow  Mood:  tearful, overwhelmed  Affect: flat  Perception: within normal limits  Thought Content: within normal limits  Thought Process: logical, coherent  Insight: good  Judgment: intact  Ability to understand instructions: Yes  Ability to respond meaningfully: Yes  Morbid Ideation: passive thoughts of death  Suicide Assessment: suicidal ideation without plan or intent  Homicidal Ideation: no    A:  This was only the second visit with the patient so the focus remains on helping her through this acute phase of managing her overwhelmed feelings and discovering what kinds of things might be able to help her.    GAD 7 SCORE 05/26/2020   GAD-7 Total Score 21     Interpretation of GAD-7 score: 5-9 = mild anxiety, 10-14 = moderate anxiety, 15+ = severe anxiety. Recommend referral to behavioral health for scores 10 or greater.    PHQ Scores 05/26/2020 02/22/2020   PHQ2 Score 5 2   PHQ9 Score 25 2     Interpretation of Total Score Depression Severity: 1-4 = Minimal depression, 5-9 = Mild depression, 10-14 = Moderate depression, 15-19 = Moderately severe depression, 20-27 = Severe depression    Diagnosis:    1. Acute stress disorder    2. Major depressive disorder, recurrent episode, moderate (HCC)        There is no problem list on file for this patient.        Plan:  Pt interventions:  Established rapport, Agenda-setting to identify pt's primary goals for [facility] visit / overall health, Supportive techniques, Provided Psychoeducation re: managing crisis stress, Emphasized self-care as important for managing overall health and Provided handout on deep breathing and managing thinking errors.       Pt Behavioral Change Plan:  Pt set the following goals:  Pt scheduled F/U visit in one week  See section 'A'

## 2020-06-09 ENCOUNTER — Telehealth: Admit: 2020-06-09 | Discharge: 2020-06-09 | Payer: PRIVATE HEALTH INSURANCE | Attending: Psychologist | Primary: Family

## 2020-06-09 DIAGNOSIS — F430 Acute stress reaction: Secondary | ICD-10-CM

## 2020-06-09 NOTE — Progress Notes (Signed)
Behavioral Health Consultation  [name], Ph.D.  Psychologist  06/09/2020  3:30 PM EST      Time spent with Patient: 30 minutes  This is patient's third [facility] appointment.    Reason for Consult: acute stress, panic disorder  Referring Provider: Amie Portland, APRN - CNP  [address]  [facility] [address]    Feedback for PCP:     Pt provided informed consent for the behavioral health program. Discussed with patient model of service to include the limits of confidentiality (i.e. abuse reporting, suicide intervention, etc.) and short-term intervention focused approach.  Pt indicated understanding.  Feedback given to PCP.        TELEHEALTH VISIT -- Audio Only (During COVID-19 public health emergency)  }  Pursuant to the emergency declaration under the [employer] and the [employer] 113[employer] waiver authority and the [employer] Act, this phone call was conducted, with patient's consent, to reduce the patient's risk of exposure to COVID-19 and provide continuity of care for an established patient. Services were provided through a phone call discussion to substitute for in-person clinic visit. Pt gave verbal informed consent to participate in telehealth services.     Consent:  She and/or health care decision maker is aware that that she may receive a bill for this telephone service, depending on her insurance coverage, and has provided verbal consent to proceed: Yes    Conducted a risk-benefit analysis and determined that the patient's presenting problems are consistent with the use of telepsychology. Determined that the patient has sufficient knowledge and skills in the use of technology enabling them to adequately benefit from telepsychology. It was determined that this patient was able to be properly treated without an in-person session. Patient verified that they were currently located at the [place] address that was provided during  registration.    Verified the following information:  Patient's identification: Yes  Patient location: [address]  [place] [address]  Patient's call back number: [phone]   Patient's emergency contact's name and number, as well as permission to contact them if needed: Extended Emergency Contact Information  Primary Emergency Contact: Margareta, Laureano  Home Phone: [phone]  Relation: Spouse  Preferred language: English  Interpreter needed? No     Provider location: [place], [place]  Patient location; [place], [place]    I affirm this is an episode with a patient who has not had a related appointment within my department in the past 7 days or scheduled within the next 24 hours.   S:  Patient talked at length about how the job she accepted and the job that she holds are very different in that, while she performs the same duties, she explained that she is pressured to falsify numbers, selectively reports this and not that, and other practices that are some combination of unethical and intentionally misleading, and are contrary to her personal and professional integrity. Her acute stress disorder was developed in an environment that was toxic to her personally and professionally. She said that she is processing FMLA paperwork and becomes acutely anxiously and panicky when she even thinks of returning to that environment. She said that her husband suggested that she not go back and look for a job that doesn't conflict with her morals and values.         Social History     Tobacco Use   ??? Smoking status: Never Smoker   ??? Smokeless tobacco: Never Used  Substance Use Topics   ??? Alcohol use: Not Currently        Illicit drugs:   Social History     Substance and Sexual Activity   Drug Use Never        O:  MSE:  Appearance: good hygiene   Attitude: cooperative and in great  distress  Consciousness: alert  Orientation: oriented to person, place, time, general circumstance  Memory: recent and remote memory  intact  Attention/Concentration: variable  Psychomotor Activity: psychomotor retardation  Eye Contact: not good quality  Speech: low volume and slow  Mood: tearful but feeling better she is out of the toxic environment  Affect: flat  Perception: within normal limits  Thought Content: within normal limits  Thought Process: logical, coherent  Insight: good  Judgment: intact  Ability to understand instructions: Yes  Ability to respond meaningfully: Yes  Morbid Ideation: passive thoughts of death  Suicide Assessment: suicidal ideation without plan or intent  Homicidal Ideation: no    A:  When the patient was able to accept that she doesn't have to go back to that job, she seemed to begin to feel some relief. Her presentation during the first two visits was characterized by symptoms of PTSD, but since it hadn't been long enough to use that diagnosis, an acute stress disorder is appropriate and is commonly the precurser to PTSD if there is no relief from the source of the problem.     GAD 7 SCORE 05/26/2020   GAD-7 Total Score 21     Interpretation of GAD-7 score: 5-9 = mild anxiety, 10-14 = moderate anxiety, 15+ = severe anxiety. Recommend referral to behavioral health for scores 10 or greater.    PHQ Scores 05/26/2020 02/22/2020   PHQ2 Score 5 2   PHQ9 Score 25 2     Interpretation of Total Score Depression Severity: 1-4 = Minimal depression, 5-9 = Mild depression, 10-14 = Moderate depression, 15-19 = Moderately severe depression, 20-27 = Severe depression    Diagnosis:    1. Acute stress disorder        There is no problem list on file for this patient.        Plan:  Pt interventions:  Established rapport, Agenda-setting to identify pt's primary goals for [facility] visit / overall health, Supportive techniques, Provided Psychoeducation re: managing crisis stress, Emphasized self-care as important for managing overall health and Provided handout on deep breathing and managing thinking errors.       Pt Behavioral Change Plan:  Pt  set the following goals:  Pt scheduled F/U visit in one week  See section 'A'

## 2020-06-10 ENCOUNTER — Ambulatory Visit: Payer: PRIVATE HEALTH INSURANCE | Primary: Family

## 2020-06-10 ENCOUNTER — Inpatient Hospital Stay: Payer: PRIVATE HEALTH INSURANCE | Primary: Family

## 2020-06-12 ENCOUNTER — Encounter

## 2020-06-12 MED ORDER — ESCITALOPRAM OXALATE 20 MG PO TABS
20 MG | ORAL_TABLET | ORAL | 0 refills | Status: DC
Start: 2020-06-12 — End: 2020-07-10

## 2020-06-18 ENCOUNTER — Ambulatory Visit: Admit: 2020-06-18 | Discharge: 2020-06-18 | Payer: PRIVATE HEALTH INSURANCE | Attending: Family | Primary: Family

## 2020-06-18 DIAGNOSIS — F410 Panic disorder [episodic paroxysmal anxiety] without agoraphobia: Secondary | ICD-10-CM

## 2020-06-18 MED ORDER — BUSPIRONE HCL 5 MG PO TABS
5 MG | ORAL_TABLET | Freq: Three times a day (TID) | ORAL | 0 refills | Status: DC | PRN
Start: 2020-06-18 — End: 2020-07-16

## 2020-06-18 NOTE — Progress Notes (Signed)
SUBJECTIVE/OBJECTIVE:  HPI  Chief Complaint: Follow-up (2 month follow up )      Debra Griffin (DOB:  [birth year]) is a 40 y.o. female here for fu of anxiety - initially seen by me on 05/02/20 and started on lexapro - she has had a few appts with psychology Dr Hulda Humphrey - presented to see Zane Herald NP 05/20/20 for worsening panic attacks. lexapro was increased to 20mg  and started on hydroxyzine. She was also given fmla paperwork to be off work. Today she reports she is doing better than she had been previously - feels like she is getting back to herself. lexapro is helping regulate mood and irritability - takes all of her meds at the same time in the AM and has some fogginess for a few hours afterwards - but benefits of medication outweigh this feeling. Panic attacks less - just occurring when shes having to deal with work investigation issues. buspar and hydroxyzine helpful - hydroxyzine makes her tired. Denies si/hi. Not feeling as hopeless as she was. Planning on getting severance from work when is up and is now starting to put feelers out for new jobs. Mom is doing ok right now.     Review of Systems   Constitutional: Negative.  Negative for chills and fever.   HENT: Negative.    Respiratory: Negative.    Cardiovascular: Negative.    Gastrointestinal: Negative.  Negative for nausea and vomiting.   Genitourinary: Negative.    Musculoskeletal: Negative.    Skin: Negative.  Negative for rash and wound.   Neurological: Negative.  Negative for dizziness, light-headedness and headaches.   Psychiatric/Behavioral: Positive for dysphoric mood. Negative for decreased concentration, self-injury and suicidal ideas. The patient is nervous/anxious.        Physical Exam  Vitals reviewed.   Constitutional:       General: She is not in acute distress.     Appearance: Normal appearance. She is well-developed. She is not ill-appearing, toxic-appearing or diaphoretic.   HENT:      Head: Normocephalic.      Right Ear:  Hearing and external ear normal.      Left Ear: Hearing and external ear normal.      Nose: Nose normal.      Mouth/Throat:      Lips: Pink.      Mouth: Mucous membranes are moist.      Pharynx: Oropharynx is clear. Uvula midline.   Eyes:      General: Lids are normal. Vision grossly intact.      Conjunctiva/sclera: Conjunctivae normal.      Pupils: Pupils are equal, round, and reactive to light.   Neck:      Trachea: Trachea and phonation normal.   Cardiovascular:      Rate and Rhythm: Normal rate and regular rhythm.      Pulses: Normal pulses.      Heart sounds: Normal heart sounds.   Pulmonary:      Effort: Pulmonary effort is normal. No accessory muscle usage or respiratory distress.      Breath sounds: Normal breath sounds and air entry. No decreased air movement or transmitted upper airway sounds. No decreased breath sounds, wheezing, rhonchi or rales.   Chest:      Chest wall: No tenderness.   Musculoskeletal:         General: No swelling. Normal range of motion.      Cervical back: Normal range of motion and neck supple.   Lymphadenopathy:  Cervical: No cervical adenopathy.   Skin:     General: Skin is warm and dry.      Capillary Refill: Capillary refill takes less than 2 seconds.   Neurological:      Mental Status: She is alert and oriented to person, place, and time. Mental status is at baseline.   Psychiatric:         Attention and Perception: Attention and perception normal.         Mood and Affect: Mood and affect normal.         Speech: Speech normal.         Behavior: Behavior normal. Behavior is cooperative.         Thought Content: Thought content normal.         Cognition and Memory: Cognition and memory normal.         Judgment: Judgment normal.         Vitals:    06/18/20 1528   BP: 134/74   Pulse: 72   Resp: 14   Temp: 97.5 ??F (36.4 ??C)   TempSrc: Temporal   SpO2: 99%   Weight: 293 lb (132.9 kg)   Height: 5\' 10"  (1.778 m)       There were no vitals filed for this  visit.      ASSESSMENT/PLAN:  1. Panic attacks  -     busPIRone (BUSPAR) 5 MG tablet; Take 1 tablet by mouth 3 times daily as needed (anxiety), Disp-60 tablet, R-0Normal  Continue lexapro at current dose - pt happy with current dose - suggested splitting up norvasc and lexapro - take lexapro in PM - see if she can correlate one medication with foggy feeling - if lexapro is taken at night she may have less symptoms since she is sleeping. buspar or hydroxyzine PRN. Continue with Dr . Fu in 1 mo to touch base, it will be 2 wks before FMLA is up.     2. Acute stress disorder  -     busPIRone (BUSPAR) 5 MG tablet; Take 1 tablet by mouth 3 times daily as needed (anxiety), Disp-60 tablet, R-0Normal      Return in about 1 month (around 07/16/2020) for mental health, extended appointment. OR sooner with questions, concerns, worsening symptoms  Patient verbalized understanding and agreement to plan.    On this date 06/18/2020 I have spent 30 minutes reviewing previous notes, test results and face to face with the patient discussing the diagnosis and importance of compliance with the treatment plan as well as documenting on the day of the visit.      Documentation was Griffin using voice recognition dragon software. Every effort was made to ensure accuracy; however, inadvertent unintentional computerized transcription errors may be present.   An electronic signature was used to authenticate this note.      --08/16/2020, APRN - CNP

## 2020-06-23 ENCOUNTER — Telehealth: Admit: 2020-06-23 | Discharge: 2020-06-23 | Payer: PRIVATE HEALTH INSURANCE | Attending: Psychologist | Primary: Family

## 2020-06-23 DIAGNOSIS — F430 Acute stress reaction: Secondary | ICD-10-CM

## 2020-06-23 NOTE — Progress Notes (Signed)
Behavioral Health Consultation  Debra Griffin, Ph.D.  Psychologist  06/23/2020  3:30 PM EST      Time spent with Patient: 17 minutes  This is patient's fourth [facility] appointment.    Reason for Consult: acute stress, panic disorder  Referring Provider: Amie Portland, APRN - CNP  [address]  [facility] [address]    Feedback for PCP:     Pt provided informed consent for the behavioral health program. Discussed with patient model of service to include the limits of confidentiality (i.e. abuse reporting, suicide intervention, etc.) and short-term intervention focused approach.  Pt indicated understanding.  Feedback given to PCP.        TELEHEALTH VISIT -- Audio Only (During COVID-19 public health emergency)  }  Pursuant to the emergency declaration under the [employer] and the [employer] waiver authority and the [employer] Act, this phone call was conducted, with patient's consent, to reduce the patient's risk of exposure to COVID-19 and provide continuity of care for an established patient. Services were provided through a phone call discussion to substitute for in-person clinic visit. Pt gave verbal informed consent to participate in telehealth services.     Consent:  She and/or health care decision maker is aware that that she may receive a bill for this telephone service, depending on her insurance coverage, and has provided verbal consent to proceed: Yes    Conducted a risk-benefit analysis and determined that the patient's presenting problems are consistent with the use of telepsychology. Determined that the patient has sufficient knowledge and skills in the use of technology enabling them to adequately benefit from telepsychology. It was determined that this patient was able to be properly treated without an in-person session. Patient verified that they were currently located at the [place] address that was provided during  registration.    Verified the following information:  Patient's identification: Yes  Patient location: [address]  [place] [address]  Patient's call back number: [phone]   Patient's emergency contact's name and number, as well as permission to contact them if needed: Extended Emergency Contact Information  Primary Emergency Contact: Debra Griffin, Debra Griffin  Home Phone: [phone]  Relation: Spouse  Preferred language: English  Interpreter needed? No     Provider location: [place], [place]  Patient location; [place], [place]    I affirm this is an episode with a patient who has not had a related appointment within my department in the past 7 days or scheduled within the next 24 hours.   S:  Patient reports that some of the cognitive symptoms of acute stress disorder, often the precursor to PTSD, seem to be slowly returning. She said that her employer's HR department keeps contacting her to investigate the ethics complaint she filed and that whenever she sees an email from them or receives a phone call from them, it triggers her. She said that she is beginning to be able to read for comprehension, that the "fog" may be lifting somewhat. She said she does not yet feel like herself, but that she has been able to put her resume online and that a few companies have already asked to interview her, which she says helps restore her confidence in herself.         Social History     Tobacco Use   ??? Smoking status: Never Smoker   ??? Smokeless tobacco: Never Used   Substance Use Topics   ??? Alcohol use: Not  Currently        Illicit drugs:   Social History     Substance and Sexual Activity   Drug Use Never        O:  MSE:  Appearance: good hygiene   Attitude: cooperative and in great  distress  Consciousness: alert  Orientation: oriented to person, place, time, general circumstance  Memory: recent and remote memory intact  Attention/Concentration: variable  Psychomotor Activity: psychomotor retardation  Eye Contact: not  good quality  Speech: low volume and slow  Mood: tearful but feeling better she is out of the toxic environment  Affect: flat  Perception: within normal limits  Thought Content: within normal limits  Thought Process: logical, coherent  Insight: good  Judgment: intact  Ability to understand instructions: Yes  Ability to respond meaningfully: Yes  Morbid Ideation: passive thoughts of death  Suicide Assessment: suicidal ideation without plan or intent  Homicidal Ideation: no    A:  The patient is slowly recovering from being the target of attempts to convince her to act in unscrupulous ways, which was deeply unsettling and triggered her crisis. She is making progress but is still shaken by her experience. She said that being contacted by potential employers is helping her understand that her experience at her former employer was due to a dynamic in that culture that was discrepant and incongruent with her sense of professional and personal ethics and values. She said that she will increase her practice of deep breathing to help her emotion regulation skills.     GAD 7 SCORE 05/26/2020   GAD-7 Total Score 21     Interpretation of GAD-7 score: 5-9 = mild anxiety, 10-14 = moderate anxiety, 15+ = severe anxiety. Recommend referral to behavioral health for scores 10 or greater.    PHQ Scores 05/26/2020 02/22/2020   PHQ2 Score 5 2   PHQ9 Score 25 2     Interpretation of Total Score Depression Severity: 1-4 = Minimal depression, 5-9 = Mild depression, 10-14 = Moderate depression, 15-19 = Moderately severe depression, 20-27 = Severe depression    Diagnosis:    1. Acute stress disorder    2. Persistent depressive disorder with anxious distress, currently severe        There is no problem list on file for this patient.        Plan:  Pt interventions:  Established rapport, Agenda-setting to identify pt's primary goals for [facility] visit / overall health, Supportive techniques, Provided Psychoeducation re: managing crisis stress,  Emphasized self-care as important for managing overall health and Provided handout on deep breathing and managing thinking errors.       Pt Behavioral Change Plan:  Pt set the following goals:  Pt scheduled F/U visit in two weeks  See section 'A'

## 2020-07-07 ENCOUNTER — Encounter: Payer: PRIVATE HEALTH INSURANCE | Attending: Psychologist | Primary: Family

## 2020-07-07 NOTE — Progress Notes (Deleted)
Behavioral Health Consultation  [name], Ph.D.  Psychologist  07/07/2020  3:30 PM EST      Time spent with Patient: 17 minutes  This is patient's fourth [facility] appointment.    Reason for Consult: acute stress, panic disorder  Referring Provider: Amie Portland, APRN - CNP  [address]  [facility] [id number]    Feedback for PCP:     Pt provided informed consent for the behavioral health program. Discussed with patient model of service to include the limits of confidentiality (i.e. abuse reporting, suicide intervention, etc.) and short-term intervention focused approach.  Pt indicated understanding.  Feedback given to PCP.        TELEHEALTH VISIT -- Audio Only (During COVID-19 public health emergency)  }  Pursuant to the emergency declaration under the [employer] and the [employer], 1135 waiver authority and the [employer] Act, this phone call was conducted, with patient's consent, to reduce the patient's risk of exposure to COVID-19 and provide continuity of care for an established patient. Services were provided through a phone call discussion to substitute for in-person clinic visit. Pt gave verbal informed consent to participate in telehealth services.     Consent:  She and/or health care decision maker is aware that that she may receive a bill for this telephone service, depending on her insurance coverage, and has provided verbal consent to proceed: Yes    Conducted a risk-benefit analysis and determined that the patient's presenting problems are consistent with the use of telepsychology. Determined that the patient has sufficient knowledge and skills in the use of technology enabling them to adequately benefit from telepsychology. It was determined that this patient was able to be properly treated without an in-person session. Patient verified that they were currently located at the [place] address that was provided during  registration.    Verified the following information:  Patient's identification: Yes  Patient location: [address]  [place] [address]  Patient's call back number: [phone]   Patient's emergency contact's name and number, as well as permission to contact them if needed: Extended Emergency Contact Information  Primary Emergency Contact: Dyanne, [place]  Home Phone: [phone]  Relation: Spouse  Preferred language: English  Interpreter needed? No     Provider location: [place], [place]  Patient location; [place], [place]    I affirm this is an episode with a patient who has not had a related appointment within my department in the past 7 days or scheduled within the next 24 hours.   S:  Patient reports that some of the cognitive symptoms of acute stress disorder, often the precursor to PTSD, seem to be slowly returning. She said that her employer's HR department keeps contacting her to investigate the ethics complaint she filed and that whenever she sees an email from them or receives a phone call from them, it triggers her. She said that she is beginning to be able to read for comprehension, that the "fog" may be lifting somewhat. She said she does not yet feel like herself, but that she has been able to put her resume online and that a few companies have already asked to interview her, which she says helps restore her confidence in herself.         Social History     Tobacco Use   ??? Smoking status: Never Smoker   ??? Smokeless tobacco: Never Used   Substance Use Topics   ??? Alcohol use: Not  Currently        Illicit drugs:   Social History     Substance and Sexual Activity   Drug Use Never        O:  MSE:  Appearance: good hygiene   Attitude: cooperative and in great  distress  Consciousness: alert  Orientation: oriented to person, place, time, general circumstance  Memory: recent and remote memory intact  Attention/Concentration: variable  Psychomotor Activity: psychomotor retardation  Eye Contact: not  good quality  Speech: low volume and slow  Mood: tearful but feeling better she is out of the toxic environment  Affect: flat  Perception: within normal limits  Thought Content: within normal limits  Thought Process: logical, coherent  Insight: good  Judgment: intact  Ability to understand instructions: Yes  Ability to respond meaningfully: Yes  Morbid Ideation: passive thoughts of death  Suicide Assessment: suicidal ideation without plan or intent  Homicidal Ideation: no    A:  The patient is slowly recovering from being the target of attempts to convince her to act in unscrupulous ways, which was deeply unsettling and triggered her crisis. She is making progress but is still shaken by her experience. She said that being contacted by potential employers is helping her understand that her experience at her former employer was due to a dynamic in that culture that was discrepant and incongruent with her sense of professional and personal ethics and values. She said that she will increase her practice of deep breathing to help her emotion regulation skills.     GAD 7 SCORE 05/26/2020   GAD-7 Total Score 21     Interpretation of GAD-7 score: 5-9 = mild anxiety, 10-14 = moderate anxiety, 15+ = severe anxiety. Recommend referral to behavioral health for scores 10 or greater.    PHQ Scores 05/26/2020 02/22/2020   PHQ2 Score 5 2   PHQ9 Score 25 2     Interpretation of Total Score Depression Severity: 1-4 = Minimal depression, 5-9 = Mild depression, 10-14 = Moderate depression, 15-19 = Moderately severe depression, 20-27 = Severe depression    Diagnosis:    No diagnosis found.    There is no problem list on file for this patient.        Plan:  Pt interventions:  Established rapport, Agenda-setting to identify pt's primary goals for [facility] visit / overall health, Supportive techniques, Provided Psychoeducation re: managing crisis stress, Emphasized self-care as important for managing overall health and Provided handout on deep  breathing and managing thinking errors.       Pt Behavioral Change Plan:  Pt set the following goals:  Pt scheduled F/U visit in two weeks  See section 'A'

## 2020-07-10 ENCOUNTER — Encounter

## 2020-07-10 MED ORDER — ESCITALOPRAM OXALATE 20 MG PO TABS
20 MG | ORAL_TABLET | ORAL | 0 refills | Status: DC
Start: 2020-07-10 — End: 2020-08-18

## 2020-07-16 ENCOUNTER — Ambulatory Visit: Admit: 2020-07-16 | Discharge: 2020-07-16 | Payer: PRIVATE HEALTH INSURANCE | Attending: Family | Primary: Family

## 2020-07-16 DIAGNOSIS — F410 Panic disorder [episodic paroxysmal anxiety] without agoraphobia: Secondary | ICD-10-CM

## 2020-07-16 MED ORDER — BUSPIRONE HCL 5 MG PO TABS
5 MG | ORAL_TABLET | Freq: Three times a day (TID) | ORAL | 2 refills | Status: DC | PRN
Start: 2020-07-16 — End: 2020-11-03

## 2020-07-16 MED ORDER — OMEPRAZOLE 20 MG PO CPDR
20 MG | ORAL_CAPSULE | Freq: Every day | ORAL | 0 refills | Status: DC
Start: 2020-07-16 — End: 2020-08-18

## 2020-07-16 NOTE — Progress Notes (Signed)
SUBJECTIVE/OBJECTIVE:  HPI  Chief Complaint: Anxiety (Doesn't know if Lexapro 20 mg are working for her)      Debra Griffin (DOB:  Dec 13, 1980) is a 40 y.o. female here for follow-up of mental health.  Anxiety and acute stress disorder.  At last appointment she had reported improvement with Lexapro 20 mg and buspar and hydroxyzine as needed.  She has been working with Dr. Hulda Humphrey for CBT.  She is nearing the end of her FMLA leave of absence that she required to appropriately treat her mental health. She reports that lexapro has been effective for symptoms but now causing her nausea. She generally takes it approx 1 hr after dinner and goes to bed an hour after taking it. Has nausea the next am. Some bilious substance in her mouth occasionally. Denies nausea, vomiting, abdominal pain, coughing, belching, sore throat. No hx of GERD. She states she is tentatively due to return on 3/14 but doesn't think she can. Reports the thought of returning causes significant anxiety and panic. She is meeting with legal counsel to determine her options, would like at least a 2 wk extension on FMLA until she can figure this out.  buspar and hydroxyzine still effective.    Reports shes still having Chest tightness/ has to take a deep breath - occurs at rest and w/ exertion - lasts for an hour. No lightheaded or dizziness. No chest pain. She has already been evaluated by myself and cardiology regarding this - has stress test and echo set up for next wk.     Review of Systems   Constitutional: Negative.  Negative for chills and fever.   HENT: Negative.    Respiratory: Positive for chest tightness and shortness of breath. Negative for cough.    Cardiovascular: Negative.  Negative for chest pain and leg swelling.   Gastrointestinal: Positive for nausea. Negative for abdominal pain and vomiting.   Genitourinary: Negative.    Musculoskeletal: Negative.    Skin: Negative.  Negative for rash and wound.   Neurological: Negative.   Negative for dizziness, light-headedness and headaches.   Psychiatric/Behavioral: Negative for dysphoric mood and sleep disturbance. The patient is nervous/anxious.        Physical Exam  Vitals reviewed.   Constitutional:       General: She is not in acute distress.     Appearance: Normal appearance. She is well-developed. She is not ill-appearing, toxic-appearing or diaphoretic.   HENT:      Head: Normocephalic.      Right Ear: Hearing and external ear normal.      Left Ear: Hearing and external ear normal.      Nose: Nose normal.      Mouth/Throat:      Lips: Pink.      Mouth: Mucous membranes are moist.      Pharynx: Oropharynx is clear. Uvula midline.   Eyes:      General: Lids are normal. Vision grossly intact.      Conjunctiva/sclera: Conjunctivae normal.      Pupils: Pupils are equal, round, and reactive to light.   Neck:      Trachea: Trachea and phonation normal.   Cardiovascular:      Rate and Rhythm: Normal rate and regular rhythm.      Pulses: Normal pulses.      Heart sounds: Normal heart sounds.   Pulmonary:      Effort: Pulmonary effort is normal. No accessory muscle usage or respiratory distress.  Breath sounds: Normal breath sounds and air entry. No decreased air movement or transmitted upper airway sounds. No decreased breath sounds, wheezing, rhonchi or rales.   Chest:      Chest wall: No tenderness.   Musculoskeletal:         General: No swelling. Normal range of motion.      Cervical back: Normal range of motion and neck supple.   Lymphadenopathy:      Cervical: No cervical adenopathy.   Skin:     General: Skin is warm and dry.      Capillary Refill: Capillary refill takes less than 2 seconds.   Neurological:      Mental Status: She is alert and oriented to person, place, and time. Mental status is at baseline.   Psychiatric:         Attention and Perception: Attention and perception normal.         Mood and Affect: Mood and affect normal.         Speech: Speech normal.         Behavior:  Behavior normal. Behavior is cooperative.         Thought Content: Thought content normal.         Cognition and Memory: Cognition and memory normal.         Judgment: Judgment normal.         Vitals:    07/16/20 1423   BP: 124/86   Site: Right Upper Arm   Position: Sitting   Cuff Size: Large Adult   Pulse: 74   Resp: 12   Temp: 97.9 ??F (36.6 ??C)   TempSrc: Temporal   SpO2: 98%   Weight: 293 lb (132.9 kg)   Height: 5\' 10"  (1.778 m)       There were no vitals filed for this visit.      ASSESSMENT/PLAN:  Anberlin was seen today for anxiety.    Diagnoses and all orders for this visit:    Panic attacks  -     busPIRone (BUSPAR) 5 MG tablet; Take 1 tablet by mouth 3 times daily as needed (anxiety)  Anxiety improved w/ lexapro - having nausea the next morning following taking it. Question whether she could also be having silent reflux with bilious substance in mouth. Discussed potential tx options - could try switching lexapro but other SSRIs have similar side effect profile. Can also trial PPI at night to see if this helps. She will trial PPI at night. Advised I can extend FMLA paperwork 2 more wks until 3/28 - she will need to make decision on returning to work or not at that time - discussed general criteria for returning to work.   FU 3 mos    Acute stress disorder  -     busPIRone (BUSPAR) 5 MG tablet; Take 1 tablet by mouth 3 times daily as needed (anxiety)  See above    Nausea  -     omeprazole (PRILOSEC) 20 MG delayed release capsule; Take 1 capsule by mouth Daily  See above    Chest tightness  Previously evaluated by cardiology. Stress test and echo set up for next wk.       Return in about 3 months (around 10/16/2020) for mental health. OR sooner with questions, concerns, worsening symptoms  Patient verbalized understanding and agreement to plan.    On this date 07/16/2020 I have spent 35 minutes reviewing previous notes, test results and face to face with the patient discussing  the diagnosis and importance of  compliance with the treatment plan as well as documenting on the day of the visit.      Documentation was done using voice recognition dragon software. Every effort was made to ensure accuracy; however, inadvertent unintentional computerized transcription errors may be present.   An electronic signature was used to authenticate this note.      --Amie Portland, APRN - CNP

## 2020-07-24 ENCOUNTER — Inpatient Hospital Stay: Payer: PRIVATE HEALTH INSURANCE | Primary: Family

## 2020-07-24 ENCOUNTER — Ambulatory Visit: Payer: PRIVATE HEALTH INSURANCE | Primary: Family

## 2020-07-28 ENCOUNTER — Encounter: Attending: Psychologist | Primary: Family

## 2020-07-28 ENCOUNTER — Telehealth: Admit: 2020-07-28 | Discharge: 2020-07-28 | Payer: PRIVATE HEALTH INSURANCE | Attending: Psychologist | Primary: Family

## 2020-07-28 DIAGNOSIS — F430 Acute stress reaction: Secondary | ICD-10-CM

## 2020-07-28 NOTE — Progress Notes (Signed)
Behavioral Health Consultation  [name], Ph.D.  Psychologist  07/28/2020  9:30 AM EST      Time spent with Patient: 25 minutes  This is patient's fourth [facility] appointment.    Reason for Consult: acute stress, panic disorder  Referring Provider: Amie Portland, APRN - CNP  [address]  [facility] [address]    Feedback for PCP:     Pt provided informed consent for the behavioral health program. Discussed with patient model of service to include the limits of confidentiality (i.e. abuse reporting, suicide intervention, etc.) and short-term intervention focused approach.  Pt indicated understanding.  Feedback given to PCP.        TELEHEALTH VISIT -- Audio Only (During COVID-19 public health emergency)  }  Pursuant to the emergency declaration under the [employer] and the [employer], 1135 waiver authority and the [employer] Act, this phone call was conducted, with patient's consent, to reduce the patient's risk of exposure to COVID-19 and provide continuity of care for an established patient. Services were provided through a phone call discussion to substitute for in-person clinic visit. Pt gave verbal informed consent to participate in telehealth services.     Consent:  She and/or health care decision maker is aware that that she may receive a bill for this telephone service, depending on her insurance coverage, and has provided verbal consent to proceed: Yes    Conducted a risk-benefit analysis and determined that the patient's presenting problems are consistent with the use of telepsychology. Determined that the patient has sufficient knowledge and skills in the use of technology enabling them to adequately benefit from telepsychology. It was determined that this patient was able to be properly treated without an in-person session. Patient verified that they were currently located at the [place] address that was provided during  registration.    Verified the following information:  Patient's identification: Yes  Patient location: [address]  [place] [address]  Patient's call back number: [phone]   Patient's emergency contact's name and number, as well as permission to contact them if needed: Extended Emergency Contact Information  Primary Emergency Contact: Derrisha, [place]  Home Phone: [phone]  Relation: Spouse  Preferred language: English  Interpreter needed? No     Provider location: [place], [place]  Patient location; [place], [place]    I affirm this is an episode with a patient who has not had a related appointment within my department in the past 7 days or scheduled within the next 24 hours.     S:  The patient took the virtual visit from her office back at [facility] [employer], where she was to have some meetings with HR and to secure a severance package. She said that [place] was disbanding the department where she worked likely due to all the problems she saw when she was there before her FMLA. The patient was anxious but not overly so for being back in the belly of the beast. She said that she believes she has secured a new position in a smaller company and was told that work/life balance will not be a problem.          Social History     Tobacco Use   ??? Smoking status: Never Smoker   ??? Smokeless tobacco: Never Used   Substance Use Topics   ??? Alcohol use: Not Currently        Illicit drugs:   Social History     Substance  and Sexual Activity   Drug Use Never        O:  MSE:  Appearance: good hygiene   Attitude: cooperative and in great  distress  Consciousness: alert  Orientation: oriented to person, place, time, general circumstance  Memory: recent and remote memory intact  Attention/Concentration: variable  Psychomotor Activity: psychomotor retardation  Eye Contact: not good quality  Speech: low volume and slow  Mood: tearful but feeling better she is out of the toxic environment  Affect: flat  Perception: within normal  limits  Thought Content: within normal limits  Thought Process: logical, coherent  Insight: good  Judgment: intact  Ability to understand instructions: Yes  Ability to respond meaningfully: Yes  Morbid Ideation: passive thoughts of death  Suicide Assessment: suicidal ideation without plan or intent  Homicidal Ideation: no    A:  The patient is is feeling much better and is almost fully recovered from her acute stress disorder. We talked about how she needs to breathe while she finishes things up at [employer], and how since she has a new position, whatever they say to her makes no difference. Her new path is secured and she is even somewhat vindicated that the department is shutting down. She seemed much better.     GAD 7 SCORE 05/26/2020   GAD-7 Total Score 21     Interpretation of GAD-7 score: 5-9 = mild anxiety, 10-14 = moderate anxiety, 15+ = severe anxiety. Recommend referral to behavioral health for scores 10 or greater.    PHQ Scores 05/26/2020 02/22/2020   PHQ2 Score 5 2   PHQ9 Score 25 2     Interpretation of Total Score Depression Severity: 1-4 = Minimal depression, 5-9 = Mild depression, 10-14 = Moderate depression, 15-19 = Moderately severe depression, 20-27 = Severe depression    Diagnosis:    1. Acute stress disorder        There is no problem list on file for this patient.        Plan:  Pt interventions:  Established rapport, Agenda-setting to identify pt's primary goals for [facility] visit / overall health, Supportive techniques, Provided Psychoeducation re: managing crisis stress, Emphasized self-care as important for managing overall health and Provided handout on deep breathing and managing thinking errors.       Pt Behavioral Change Plan:  Pt set the following goals:  Pt scheduled F/U visit in two weeks  See section 'A'

## 2020-08-01 NOTE — Progress Notes (Signed)
Covid testing to be done: DOS    If positive---Pt instructed to notify MD ASAP  If negative--pt was instructed to bring Hard copy of Covid results DOP/DOS    Preoperative Screening for Elective Surgery/Invasive Procedures While COVID-19 present in the community     1. Have you tested positive or have been told to self-isolate for COVID-19 like symptoms within the past 28 days?  2. Do you currently have any of the following symptoms?  ? Fever >100.0 F or 99.9 F in immunocompromised patients?  ? New onset cough, shortness of breath or difficulty breathing?  ? New onset sore throat, myalgia (muscle aches and pains), headache, loss of taste/smell or diarrhea?  3. Have you had a potential exposure to COVID-19 within the past 14 days by:  ? Close contact with a confirmed case?  ? Close contact with a Research scientist (physical sciences), first responder or essential Holiday representative (grocery store, [employer], gas station, public utilities or transportation)?  ? Do you reside in a congregate setting such as; skilled nursing facility, adult home, correctional facility, homeless shelter or other institutional setting?  ? Have you had recent travel to a known COVID-19 hotspot?     Indicate if the patient has a positive screen by answering yes to one or more of the above questions.    If patient is unable to obtain a COVID test prior to DOS/DOP will need RAPID DOS.C-Difficile admission screening and protocol:       * Admitted with diarrhea?                         []  YES    [x]   NO     *Prior history of C-Diff. In last 3 months? []  YES    [x]   NO     *Antibiotic use in the past 6-8 weeks?      [x]   NO    []   YES      If yes, which: REASON_________________     *Prior hospitalization or nursing home in the last month? []   YES    [x]   NO     SAFETY FIRST..call before you fall    [facility] PRE-OPERATIVE INSTRUCTIONS    Arrival time__0730_____        Surgery time__0900____    Do not eat or drink anything after 12:00 midnight prior to  your surgery.  This includes water chewing gum, mints and ice chips- the Day of Surgery.  You may brush your teeth and gargle the morning of your surgery, but do not swallow the water     Please see your family doctor/pediatrician for a history and physical and/or questions concerning medications.   Bring any test results/reports from your physicians office.   If you are under the care of a heart doctor or specialist doctor, please be aware that you may be asked to them for clearance    You may be asked to stop blood thinners such as Coumadin, Plavix, Fragmin, Lovenox, etc., or any anti-inflammatories such as:  Aspirin, Ibuprofen, Advil, Naproxen prior to your surgery.    We also ask that you stop any OTC medications such as fish oil, vitamin E, glucosamine, garlic, Multivitamins, COQ 10, etc.    We ask that you do not smoke 24 hours prior to surgery  We ask that you do not  drink any alcoholic beverages 24 hours prior to surgery     You must make arrangements for a responsible  adult to take you home after your surgery.    For your safety you will not be allowed to leave alone or drive yourself home.  Your surgery will be cancelled if you do not have a ride home.     Also for your safety, it is strongly suggested that someone stay with you the first 24 hours after your surgery.     A parent or legal guardian must accompany a child scheduled for surgery and plan to stay at the hospital until the child is discharged.    Please do not bring other children with you.    For your comfort, please wear simple loose fitting clothing to the hospital.  Please do not bring valuables. Do not wear any make-up or nail polish on your fingers or toes.  For your safety, please do not wear any jewelry or body piercing's on the day of surgery. All jewelry must be removed.     If you have dentures, they will be removed before going to operating room.    For your convenience, we will provide you with a container.    If you wear contact  lenses or glasses, they will be removed, please bring a case for them.     If you have a living will and a durable power of attorney for healthcare, please bring in a copy.     As part of our patient safety program to minimize surgical site infections, we ask you to do the following:    ?? Please notify your surgeon if you develop any illness between         now and the day of your surgery.    ?? This includes a cough, cold, fever, sore throat, nausea,         or vomiting, and diarrhea, etc.  ??  Please notify your surgeon if you experience dizziness, shortness         of breath or blurred vision between now and the time of your surgery.      Do not shave your operative site 96 hours prior to surgery.   For face and neck surgery, men may use an electric razor 48 hours   prior to surgery.    You may shower the night before surgery or the morning of   your surgery with an antibacterial soap.    You will need to bring a photo ID and insurance card     If you use a C-pap or Bi-pap machine, please bring your machine with you to the hospital     Our goal is to provide you with excellent care, therefore, visitors will be limited to so that we may focus on providing this care for you.          Please contact your surgeon office, if you have any further questions.                 [facility] phone number:  [place] [facility] fax number:  [phone]    Please note these are generalized instructions for all surgical cases, you may be provided with more specific instructions according to your surgery.

## 2020-08-18 ENCOUNTER — Encounter

## 2020-08-18 MED ORDER — ESCITALOPRAM OXALATE 20 MG PO TABS
20 MG | ORAL_TABLET | ORAL | 2 refills | Status: DC
Start: 2020-08-18 — End: 2020-12-08

## 2020-08-18 MED ORDER — OMEPRAZOLE 20 MG PO CPDR
20 MG | ORAL_CAPSULE | ORAL | 2 refills | Status: DC
Start: 2020-08-18 — End: 2020-12-08

## 2020-09-18 ENCOUNTER — Ambulatory Visit: Payer: PRIVATE HEALTH INSURANCE | Primary: Family

## 2020-09-18 ENCOUNTER — Inpatient Hospital Stay: Payer: PRIVATE HEALTH INSURANCE | Primary: Family

## 2020-10-29 ENCOUNTER — Encounter: Attending: Family | Primary: Family

## 2020-10-29 NOTE — Progress Notes (Deleted)
SUBJECTIVE/OBJECTIVE:  HPI  Chief Complaint: No chief complaint on file.      Debra Griffin (DOB:  September 03, 1980) is a 40 y.o. female here for fu of mental health. Anxiety and acute stress disorder. She ahs been on lexapro 20mg  and taking buspar 5 mg and hydroxyzine PRN.         Review of Systems    Physical Exam    There were no vitals filed for this visit.    There were no vitals filed for this visit.      ASSESSMENT/PLAN:  There are no diagnoses linked to this encounter.        No follow-ups on file. OR sooner with questions, concerns, worsening symptoms  Patient verbalized understanding and agreement to plan.    {Time Documentation Optional:[id number]}      Documentation was done using voice recognition dragon software. Every effort was made to ensure accuracy; however, inadvertent unintentional computerized transcription errors may be present.   An electronic signature was used to authenticate this note.      -- , APRN - CNP

## 2020-11-02 ENCOUNTER — Encounter

## 2020-11-03 MED ORDER — BUSPIRONE HCL 5 MG PO TABS
5 MG | ORAL_TABLET | Freq: Three times a day (TID) | ORAL | 0 refills | Status: DC | PRN
Start: 2020-11-03 — End: 2020-12-03

## 2020-11-03 NOTE — Telephone Encounter (Signed)
Pt due for fu

## 2020-11-05 NOTE — Telephone Encounter (Signed)
Spoke to pt, she will call back to schedule.

## 2020-12-03 ENCOUNTER — Encounter

## 2020-12-03 MED ORDER — BUSPIRONE HCL 5 MG PO TABS
5 MG | ORAL_TABLET | Freq: Three times a day (TID) | ORAL | 0 refills | Status: DC | PRN
Start: 2020-12-03 — End: 2020-12-18

## 2020-12-03 NOTE — Telephone Encounter (Signed)
Pt was told 1 mo ago she is due for fu  She needs to make an appt for fu or she will not get any further refills

## 2020-12-05 NOTE — Telephone Encounter (Signed)
Pt scheduled appt.

## 2020-12-07 ENCOUNTER — Encounter

## 2020-12-08 MED ORDER — ESCITALOPRAM OXALATE 20 MG PO TABS
20 MG | ORAL_TABLET | ORAL | 0 refills | Status: DC
Start: 2020-12-08 — End: 2021-10-19

## 2020-12-08 MED ORDER — OMEPRAZOLE 20 MG PO CPDR
20 MG | ORAL_CAPSULE | ORAL | 0 refills | Status: AC
Start: 2020-12-08 — End: 2023-04-18

## 2020-12-17 ENCOUNTER — Encounter: Payer: PRIVATE HEALTH INSURANCE | Attending: Family | Primary: Family

## 2020-12-18 ENCOUNTER — Encounter

## 2020-12-18 MED ORDER — BUSPIRONE HCL 5 MG PO TABS
5 MG | ORAL_TABLET | Freq: Three times a day (TID) | ORAL | 0 refills | Status: AC | PRN
Start: 2020-12-18 — End: 2021-10-19

## 2020-12-18 NOTE — Telephone Encounter (Signed)
Called patient Debra Griffin to call the office back regarding setting up a follow up appt for a medication check

## 2020-12-18 NOTE — Telephone Encounter (Signed)
Pt was told over a month ago she needed to fu. She did not show up for appt yesterday. im sending a short supply. She will not get any further refills until she makes an appt

## 2021-02-06 NOTE — Telephone Encounter (Signed)
error 

## 2021-02-06 NOTE — Telephone Encounter (Signed)
LMOM for patient to schedule appointment, pre [place] okay.

## 2021-02-06 NOTE — Telephone Encounter (Signed)
Error

## 2021-02-09 NOTE — Telephone Encounter (Signed)
LMOM for pt call back to schedule

## 2021-07-07 ENCOUNTER — Ambulatory Visit
Admit: 2021-07-07 | Discharge: 2021-07-07 | Payer: PRIVATE HEALTH INSURANCE | Attending: Nurse Practitioner | Primary: Family

## 2021-07-07 ENCOUNTER — Encounter

## 2021-07-07 DIAGNOSIS — R0789 Other chest pain: Secondary | ICD-10-CM

## 2021-07-07 NOTE — Progress Notes (Signed)
Department of Internal Medicine  Clinic Note    Date: 07/07/2021    Subjective/Objective:     Chief Complaint   Patient presents with    Chest Pain     X1 month-         HPI     Debra Griffin is a 41 y.o. female that presents today for eval of cp x1 month.  Patient has a history significant for anxiety, obesity, and hypertension.     In reviewing patient's chart she was seen for chest pain by her PCP in October 2021  She had labs, EKG, Holter monitor, stress test and cardiology referral ordered.  It was found that patient had iron deficiency anemia and was started on iron supplement twice daily.  Also found to be prediabetic.  Repeat CBC showed slightly worsened anemia and patient was referred to hematology for possible iron infusions.  EKG 02/22/2020 showed: Sinus bradycardia, ventricular rate 58.  Patient never had Holter, stress test or echocardiogram completed.    Office visit note from Dr. Doree Fudge, [facility] 07/16/20 reviewed.  Severe iron deficiency anemia thought to be secondary to menorrhagia.  Patient was refusing IV iron so p.o. iron was continued.  Patient was referred to GI for colonoscopy and esophagogastroduodenoscopy.  Once again, patient did not follow-up with any of these recommendations.    She did establish care with Dr. Lynne Logan, cardiology.  She was still complaining of dyspnea with exertion and he ordered an echocardiogram and stress test which she did not have done.  She was also referred to a sleep referral-never completed.       Tobacco:  reports that she has never smoked. She has never used smokeless tobacco.  ETOH:  reports that she does not currently use alcohol.    Current Outpatient Medications   Medication Sig Dispense Refill    busPIRone (BUSPAR) 5 MG tablet Take 1 tablet by mouth 3 times daily as needed (anxiety) 15 tablet 0    escitalopram (LEXAPRO) 20 MG tablet TAKE 1 TABLET BY MOUTH EVERY DAY 30 tablet 0    omeprazole (PRILOSEC) 20 MG delayed release capsule TAKE 1 CAPSULE BY MOUTH  EVERY DAY 30 capsule 0    amLODIPine (NORVASC) 10 MG tablet Take 1 tablet by mouth daily 90 tablet 3    ferrous sulfate (IRON 325) 325 (65 Fe) MG tablet Take 1 tablet by mouth 2 times daily 180 tablet 1     No current facility-administered medications for this visit.       No Known Allergies    Review of Systems   Constitutional:  Negative for chills, fatigue and fever.   Respiratory:  Positive for chest tightness and shortness of breath. Negative for cough.    Cardiovascular:  Negative for chest pain.   Gastrointestinal:  Negative for abdominal pain, diarrhea, nausea and vomiting.   Genitourinary:  Negative for dysuria.   Musculoskeletal:  Negative for myalgias.   Skin:  Negative for rash.   Neurological:  Negative for dizziness, weakness and numbness.   All other systems reviewed and are negative.    Vitals:  BP 130/76 (Site: Left Upper Arm, Position: Sitting, Cuff Size: Large Adult)    Pulse 88    Temp 98.8 ??F (37.1 ??C)    Resp 14    Ht 5\' 10"  (1.778 m)    Wt 290 lb (131.5 kg)    SpO2 98%    BMI 41.61 kg/m??     Wt Readings from Last 5 Encounters:  07/07/21 290 lb (131.5 kg)   07/16/20 293 lb (132.9 kg)   06/18/20 293 lb (132.9 kg)   05/20/20 297 lb (134.7 kg)   05/02/20 292 lb 12.8 oz (132.8 kg)         Physical Exam  Vitals and nursing note reviewed.   Constitutional:       General: She is not in acute distress.     Appearance: She is well-developed. She is obese. She is not diaphoretic.   HENT:      Head: Normocephalic and atraumatic.   Eyes:      Conjunctiva/sclera: Conjunctivae normal.      Pupils: Pupils are equal, round, and reactive to light.   Neck:      Thyroid: No thyroid mass or thyromegaly.   Cardiovascular:      Rate and Rhythm: Normal rate and regular rhythm.      Heart sounds: Normal heart sounds. No murmur heard.    No friction rub. No gallop.   Pulmonary:      Effort: Pulmonary effort is normal. No respiratory distress.      Breath sounds: Normal breath sounds. No wheezing or rales.    Musculoskeletal:         General: Normal range of motion.      Cervical back: Normal range of motion and neck supple.   Skin:     General: Skin is warm and dry.      Findings: No rash.   Neurological:      Mental Status: She is alert and oriented to person, place, and time.   Psychiatric:         Behavior: Behavior normal.         Thought Content: Thought content normal.         Judgment: Judgment normal.       Assessment/Plan     Dlynn was seen today for chest pain.    Diagnoses and all orders for this visit:    Chest tightness  -     EKG 12 lead; Future.  Sinus rhythm with no ST elevation or depression.  No ectopy.  Ventricular rate 69.  No major changes when compared to previous EKG October 2021  -     CARDIAC STRESS TEST EXERCISE ONLY; Future  ER precautions discussed with patient.  I spent a lot of time talking with patient about the necessity of having these tests done.  She states that it was financial reasons why she did not do them previously.  I instructed her that in order to come to a more definitive diagnosis we need to have these test completed.  She is agreeable.    DOE (dyspnea on exertion)  -     EKG 12 lead; Future  -     CBC with Auto Differential; Future  -     TSH with Reflex; Future  -     CARDIAC STRESS TEST EXERCISE ONLY; Future  -     ECHO Complete 2D W Doppler W Color; Future  -     EKG 12 lead    Essential hypertension  Stable.  Continue current med therapy.  DASH diet.    Iron deficiency anemia, unspecified iron deficiency anemia type  -     CBC with Auto Differential; Future  Will recheck CBC today.  Patient is no longer taking her iron-unsure why she stopped it.  She has refused IV iron therapy in the past.  She has never followed up with  hematology as recommended after her last visit with them.    Past, Family, and Social history reviewed with patient during this visit.      RX management: see under diagnosis section    Orders Placed This Encounter   Procedures    CBC with Auto  Differential     Standing Status:   Future     Standing Expiration Date:   07/07/2022    TSH with Reflex     Standing Status:   Future     Standing Expiration Date:   07/07/2022    CARDIAC STRESS TEST EXERCISE ONLY     Standing Status:   Future     Standing Expiration Date:   09/05/2021     Order Specific Question:   Reason for Exam?     Answer:   Chest pain    EKG 12 lead     Standing Status:   Future     Number of Occurrences:   1     Standing Expiration Date:   09/05/2021     Order Specific Question:   Reason for Exam?     Answer:   Chest pain    ECHO Complete 2D W Doppler W Color     Standing Status:   Future     Standing Expiration Date:   07/07/2022     Order Specific Question:   Reason for exam:     Answer:   doe, cp         Return if symptoms worsen or fail to improve.    Documentation was done using voice recognition dragon software.  Every effort was made to ensure accuracy; however, inadvertent unintentional computerized transcription errors may be present.       Roel Cluck, APRN - CNP, FNP-C     07/07/2021  3:07 PM

## 2021-07-07 NOTE — Patient Instructions (Signed)
Schedule echocardiogram and cardiac stress test.

## 2021-07-08 LAB — CBC WITH AUTO DIFFERENTIAL
Basophils %: 0.4 %
Basophils Absolute: 0 10*3/uL (ref 0.0–0.2)
Eosinophils %: 1.6 %
Eosinophils Absolute: 0.2 10*3/uL (ref 0.0–0.6)
Hematocrit: 35.1 % — ABNORMAL LOW (ref 36.0–48.0)
Hemoglobin: 11 g/dL — ABNORMAL LOW (ref 12.0–16.0)
Lymphocytes %: 26.3 %
Lymphocytes Absolute: 2.8 10*3/uL (ref 1.0–5.1)
MCH: 23.3 pg — ABNORMAL LOW (ref 26.0–34.0)
MCHC: 31.3 g/dL (ref 31.0–36.0)
MCV: 74.6 fL — ABNORMAL LOW (ref 80.0–100.0)
MPV: 8.3 fL (ref 5.0–10.5)
Monocytes %: 4.5 %
Monocytes Absolute: 0.5 10*3/uL (ref 0.0–1.3)
Neutrophils %: 67.2 %
Neutrophils Absolute: 7.1 10*3/uL (ref 1.7–7.7)
Platelets: 374 10*3/uL (ref 135–450)
RBC: 4.7 M/uL (ref 4.00–5.20)
RDW: 19 % — ABNORMAL HIGH (ref 12.4–15.4)
WBC: 10.5 10*3/uL (ref 4.0–11.0)

## 2021-07-08 LAB — TSH WITH REFLEX: TSH: 2.01 u[IU]/mL (ref 0.27–4.20)

## 2021-07-09 ENCOUNTER — Encounter: Payer: PRIVATE HEALTH INSURANCE | Attending: Family | Primary: Family

## 2021-07-27 ENCOUNTER — Ambulatory Visit
Admit: 2021-07-27 | Discharge: 2021-07-27 | Payer: PRIVATE HEALTH INSURANCE | Attending: Cardiovascular Disease | Primary: Family

## 2021-07-27 DIAGNOSIS — I10 Essential (primary) hypertension: Secondary | ICD-10-CM

## 2021-07-27 MED ORDER — AMLODIPINE BESYLATE 10 MG PO TABS
10 MG | ORAL_TABLET | Freq: Every day | ORAL | 3 refills | Status: AC
Start: 2021-07-27 — End: ?

## 2021-07-27 NOTE — Patient Instructions (Signed)
Dr. Domingo Pulse [phone] [facility]

## 2021-07-27 NOTE — Progress Notes (Deleted)
Cardiology Consultation     Debra Griffin  02-17-1981      Referring Physician: Lorie Phenix, APRN-CNP  Reason for Referral: Dyspnea/Hypertension  Chief Complaint:   No chief complaint on file.      Subjective:   History of Present Illness: The patient is 41 y.o. female with a past medical history significant for hypertension. She was started on Norvasc with her PCP and reported worsening fatigue. A stress and echo was ordered but not completed.     Past Medical History:   Diagnosis Date    Anxiety     Headache     Hypertension     Irritable bowel syndrome     Tuberculosis 1998     Past Surgical History:   Procedure Laterality Date    CESAREAN SECTION      x2    EYE SURGERY Left 1996    abcess drainage behind eye    LYMPH NODE BIOPSY      + TB     Family History   Problem Relation Age of Onset    Diabetes Mother     Hypertension Mother     Kidney Disease Mother     No Known Problems Father     Hypertension Sister     Hypertension Brother     No Known Problems Brother     No Known Problems Brother     Heart Failure Maternal Grandmother     Heart Attack Maternal Grandmother     Diabetes Maternal Grandfather      Social History     Tobacco Use    Smoking status: Never    Smokeless tobacco: Never   Vaping Use    Vaping Use: Never used   Substance Use Topics    Alcohol use: Not Currently    Drug use: Never       No Known Allergies  Current Outpatient Medications   Medication Sig Dispense Refill    busPIRone (BUSPAR) 5 MG tablet Take 1 tablet by mouth 3 times daily as needed (anxiety) 15 tablet 0    escitalopram (LEXAPRO) 20 MG tablet TAKE 1 TABLET BY MOUTH EVERY DAY 30 tablet 0    omeprazole (PRILOSEC) 20 MG delayed release capsule TAKE 1 CAPSULE BY MOUTH EVERY DAY 30 capsule 0    amLODIPine (NORVASC) 10 MG tablet Take 1 tablet by mouth daily 90 tablet 3    ferrous sulfate (IRON 325) 325 (65 Fe) MG tablet Take 1 tablet by mouth 2 times daily 180 tablet 1     No current facility-administered  medications for this visit.       Review of Systems:  Constitutional: No unanticipated weight loss. There's been no change in energy level, sleep pattern, or activity level. No fevers, chills.   Eyes: No visual changes or diplopia. No scleral icterus.  ENT: No Headaches, hearing loss or vertigo. No mouth sores or sore throat.  Cardiovascular: as reviewed in HPI  Respiratory: No cough or wheezing, no sputum production. No hemoptysis.     Gastrointestinal: No abdominal pain, appetite loss, blood in stools. No change in bowel or bladder habits.  Genitourinary: No dysuria, trouble voiding, or hematuria.  Musculoskeletal:  No gait disturbance, no joint complaints.  Integumentary: No rash or pruritis.  Neurological: No headache, diplopia, change in muscle strength, numbness or tingling.   Psychiatric: No anxiety or depression.  Endocrine: No temperature intolerance. No excessive thirst, fluid intake, or urination. No tremor.  Hematologic/Lymphatic: No abnormal bruising  or bleeding, blood clots or swollen lymph nodes.  Allergic/Immunologic: No nasal congestion or hives.    Physical Exam:   There were no vitals taken for this visit.  Wt Readings from Last 3 Encounters:   07/07/21 290 lb (131.5 kg)   07/16/20 293 lb (132.9 kg)   06/18/20 293 lb (132.9 kg)     Constitutional: She is oriented to person, place, and time. She appears well-developed and well-nourished. In no acute distress.   Head: Normocephalic and atraumatic. Pupils equal and round.  Neck: Neck supple. No JVP or carotid bruit appreciated. No mass and no thyromegaly present. No lymphadenopathy present.  Cardiovascular: Normal rate. Normal heart sounds. Exam reveals no gallop and no friction rub. No murmur heard.  Pulmonary/Chest: Effort normal and breath sounds normal. No respiratory distress. She has no wheezes, rhonchi or rales.   Abdominal: Soft, non-tender. Bowel sounds are normal. She exhibits no organomegaly, mass or bruit.   Extremities: No edema. No  cyanosis or clubbing. Pulses are 2+ radial/carotid bilaterally.  Neurological: No gross cranial nerve deficit. Coordination normal.   Skin: Skin is warm and dry. There is no rash or diaphoresis.   Psychiatric: She has a normal mood and affect. Her speech is normal and behavior is normal.     Lab Review:   FLP:    Lab Results   Component Value Date/Time    TRIG 40 02/22/2020 08:33 AM    HDL 51 02/22/2020 08:33 AM    LDLCALC 87 02/22/2020 08:33 AM    LABVLDL 8 02/22/2020 08:33 AM     BUN/Creatinine:    Lab Results   Component Value Date/Time    BUN 12 02/22/2020 08:33 AM    CREATININE 0.7 02/22/2020 08:33 AM     PT/INR, TNI, HGB A1C:   Lab Results   Component Value Date/Time    LABA1C 5.7 02/22/2020 08:33 AM      No results found for: CBCAUTODIF    EKG Interpretation: 02/22/2020 Sinus bradycardia      All above diagnostic testing and laboratory data was independently visualized and reviewed by me (not simply review of report)       Assessment and Plan   1) Hypertension  UnControlled.   Increase Norvasc to 10 mg daily.     2) Dyspnea  With exertion  GXT  Echocardiogram    3) Fatigue  Sleep referral    Follow up as needed if testing is normal.     Thank you very much for allowing me to participate in the care of your patient. Please do not hesitate to contact me if you have any questions.      Caprice Beaver, MD [facility]  General, Interventional Cardiology, and Peripheral Vascular Disease   [facility]   Ph: [phone]  Fax: [phone]  \\

## 2021-07-27 NOTE — Progress Notes (Unsigned)
Cardiology Consultation     Debra Griffin  01/12/1981      Referring Physician: Lorie PhenixAllysia R Billows, APRN-CNP  Reason for Referral: Dyspnea/Hypertension  Chief Complaint:   Chief Complaint   Patient presents with    Check-Up     Light chest pain ,shortness of breath         Subjective:   History of Present Illness: The patient is 41 y.o. female with a past medical history significant for hypertension. She was started on Norvasc with her PCP and reported worsening fatigue. A stress and echo was ordered but not completed.     Patient states she has not been feeling energetic and losing her breath. Last year had low iron and has been following with hematology.     Reports that she is able to walk without difficulty or dyspnea. However when walking up stairs gets short of breath. When lying in bed, feels pain in legs. When driving, notices that ankles swell. She is fairly sedentary and does not participate in any aerobic activity. Denies any chest pains with activity. Compliant with Norvasc.     Past Medical History:   Diagnosis Date    Anxiety     Headache     Hypertension     Irritable bowel syndrome     Tuberculosis 1998     Past Surgical History:   Procedure Laterality Date    CESAREAN SECTION      x2    EYE SURGERY Left 1996    abcess drainage behind eye    LYMPH NODE BIOPSY      + TB     Family History   Problem Relation Age of Onset    Diabetes Mother     Hypertension Mother     Kidney Disease Mother     No Known Problems Father     Hypertension Sister     Hypertension Brother     No Known Problems Brother     No Known Problems Brother     Heart Failure Maternal Grandmother     Heart Attack Maternal Grandmother     Diabetes Maternal Grandfather      Social History     Tobacco Use    Smoking status: Never    Smokeless tobacco: Never   Vaping Use    Vaping Use: Never used   Substance Use Topics    Alcohol use: Not Currently    Drug use: Never       No Known Allergies  Current Outpatient  Medications   Medication Sig Dispense Refill    busPIRone (BUSPAR) 5 MG tablet Take 1 tablet by mouth 3 times daily as needed (anxiety) 15 tablet 0    escitalopram (LEXAPRO) 20 MG tablet TAKE 1 TABLET BY MOUTH EVERY DAY 30 tablet 0    omeprazole (PRILOSEC) 20 MG delayed release capsule TAKE 1 CAPSULE BY MOUTH EVERY DAY 30 capsule 0    amLODIPine (NORVASC) 10 MG tablet Take 1 tablet by mouth daily 90 tablet 3    ferrous sulfate (IRON 325) 325 (65 Fe) MG tablet Take 1 tablet by mouth 2 times daily 180 tablet 1     No current facility-administered medications for this visit.       Review of Systems:  Constitutional: No unanticipated weight loss. There's been no change in energy level, sleep pattern, or activity level. No fevers, chills.   Eyes: No visual changes or diplopia. No scleral icterus.  ENT: No Headaches, hearing loss or  vertigo. No mouth sores or sore throat.  Cardiovascular: as reviewed in HPI  Respiratory: No cough or wheezing, no sputum production. No hemoptysis.     Gastrointestinal: No abdominal pain, appetite loss, blood in stools. No change in bowel or bladder habits.  Genitourinary: No dysuria, trouble voiding, or hematuria.  Musculoskeletal:  No gait disturbance, no joint complaints.  Integumentary: No rash or pruritis.  Neurological: No headache, diplopia, change in muscle strength, numbness or tingling.   Psychiatric: No anxiety or depression.  Endocrine: No temperature intolerance. No excessive thirst, fluid intake, or urination. No tremor.  Hematologic/Lymphatic: No abnormal bruising or bleeding, blood clots or swollen lymph nodes.  Allergic/Immunologic: No nasal congestion or hives.    Physical Exam:   BP 122/80    Pulse 75    Ht 5\' 10"  (1.778 m)    Wt (!) 305 lb (138.3 kg)    SpO2 98%    BMI 43.76 kg/m??   Wt Readings from Last 3 Encounters:   07/27/21 (!) 305 lb (138.3 kg)   07/07/21 290 lb (131.5 kg)   07/16/20 293 lb (132.9 kg)     Constitutional: She is oriented to person, place, and time.  She appears well-developed and well-nourished. In no acute distress.   Head: Normocephalic and atraumatic. Pupils equal and round.  Neck: Neck supple. No JVP or carotid bruit appreciated. No mass and no thyromegaly present. No lymphadenopathy present.  Cardiovascular: Normal rate. Normal heart sounds. Exam reveals no gallop and no friction rub. No murmur heard.  Pulmonary/Chest: Effort normal and breath sounds normal. No respiratory distress. She has no wheezes, rhonchi or rales.   Abdominal: Soft, non-tender. Bowel sounds are normal. She exhibits no organomegaly, mass or bruit.   Extremities: No edema. No cyanosis or clubbing. Pulses are 2+ radial/carotid bilaterally.  Neurological: No gross cranial nerve deficit. Coordination normal.   Skin: Skin is warm and dry. There is no rash or diaphoresis.   Psychiatric: She has a normal mood and affect. Her speech is normal and behavior is normal.     Lab Review:   FLP:    Lab Results   Component Value Date/Time    TRIG 40 02/22/2020 08:33 AM    HDL 51 02/22/2020 08:33 AM    LDLCALC 87 02/22/2020 08:33 AM    LABVLDL 8 02/22/2020 08:33 AM     BUN/Creatinine:    Lab Results   Component Value Date/Time    BUN 12 02/22/2020 08:33 AM    CREATININE 0.7 02/22/2020 08:33 AM     PT/INR, TNI, HGB A1C:   Lab Results   Component Value Date/Time    LABA1C 5.7 02/22/2020 08:33 AM      No results found for: CBCAUTODIF    EKG Interpretation: 02/22/2020 Sinus bradycardia      All above diagnostic testing and laboratory data was independently visualized and reviewed by me (not simply review of report)       Assessment and Plan   1) Hypertension  Controlled on today's visit   Continue Norvasc to 10 mg daily.     2) Dyspnea  With exertion  Proceed with Echo and GXT    3) Lower extremity edema  Encouraged to wear compression socks  Elevate at night  Daily activity   Reduce sodium in diet    Follow up as needed if testing is normal.     Thank you very much for allowing me to participate in the  care of your patient. Please do  not hesitate to contact me if you have any questions.      Caprice Beaver, MD [facility]  General, Interventional Cardiology, and Peripheral Vascular Disease   [facility]   Ph: [phone]  Fax: [phone]    This note was scribed in the presence of Dr. Caprice Beaver, MD by Abundio Miu, RN  ***  I have personally reviewed all previous testing for this visit today including imaging, lab results and EKG as detailed above and in care everywhere.

## 2021-09-07 ENCOUNTER — Ambulatory Visit: Payer: PRIVATE HEALTH INSURANCE | Primary: Family

## 2021-09-07 ENCOUNTER — Inpatient Hospital Stay: Payer: PRIVATE HEALTH INSURANCE | Primary: Student in an Organized Health Care Education/Training Program

## 2021-10-19 ENCOUNTER — Encounter

## 2021-10-19 ENCOUNTER — Ambulatory Visit
Admit: 2021-10-19 | Discharge: 2021-10-19 | Payer: PRIVATE HEALTH INSURANCE | Attending: Student in an Organized Health Care Education/Training Program | Primary: Student in an Organized Health Care Education/Training Program

## 2021-10-19 DIAGNOSIS — F419 Anxiety disorder, unspecified: Secondary | ICD-10-CM

## 2021-10-19 NOTE — Assessment & Plan Note (Addendum)
Had a lot of things going on last year, she was on BuSpar and Lexapro, for about a month.  However felt foggy.  Has stopped BuSpar and Lexapro on her own.  -Doing well off medication

## 2021-10-19 NOTE — Progress Notes (Signed)
Debra Griffin (DOB:  03/13/1981) is a 41 y.o. female,New patient, here for evaluation of the following chief complaint(s):  New Patient and Annual Exam (Would like blood work completed)         ASSESSMENT/PLAN:  1. Anxiety  Assessment & Plan:  Had a lot of things going on last year, she was on BuSpar and Lexapro, for about a month.  However felt foggy.  Has stopped BuSpar and Lexapro on her own.  -Doing well off medication  2. Primary hypertension  Assessment & Plan:  Blood pressure is not at goal today  -Will have patient monitor blood pressure at home for 2-week, call with readings.  Titrate medication based on readings  Orders:  -     Hemoglobin A1C; Future  -     Lipid, Fasting; Future  -     Comprehensive Metabolic Panel; Future  -     CBC with Auto Differential; Future  3. Iron deficiency anemia, unspecified iron deficiency anemia type  Assessment & Plan:  Secondary to menorrhagia  Continue iron supplement  Orders:  -     Comprehensive Metabolic Panel; Future  4. BMI 40.0-44.9, adult [facility](HCC)  Assessment & Plan:   Counseled patient  -She was started with lifestyle modification  -Also discussed possible medical therapy such as Reginal LutesWegovy (her sister is on this medication with good result)  Orders:  -     Hemoglobin A1C; Future  -     Lipid, Fasting; Future  -     Comprehensive Metabolic Panel; Future  -     CBC with Auto Differential; Future  5. Menorrhagia with regular cycle  Assessment & Plan:   Was on OCP before but experienced irregular menstrual period  -Also has history of iron deficiency anemia  -Discussed other options of contraceptive including IUD, and implantation.  -We will have patient see gynecology  Orders:  -     [place] - Salomon MastFirestein, Scott, MD, Gynecology, [place]  6. Encounter for screening for HIV  -     HIV Screen; Future      Return in about 6 months (around 04/20/2022) for Hypertension, Weight.         Subjective   SUBJECTIVE/OBJECTIVE:  HPI  The patient is a 41 year old female who  presented today to establish care.  [facility].  She reported history of anxiety.  Last year she has multiple things going on, going through divorce, taking care of her special need kid, stress at work.  She was then started on BuSpar and Lexapro.  However it made her feel foggy.  She has stopped both of these medications on her own.  Has been doing well off medications.   She also has a history of iron deficiency secondary to menorrhagia.  Has been taking iron 2 times daily.       Review of Systems   Constitutional:  Negative for activity change, appetite change, chills, diaphoresis, fatigue, fever and unexpected weight change.   HENT:  Negative for trouble swallowing and voice change.    Eyes:  Negative for photophobia and visual disturbance.   Respiratory:  Negative for apnea, cough, choking, chest tightness, shortness of breath, wheezing and stridor.    Cardiovascular:  Negative for chest pain, palpitations and leg swelling.   Gastrointestinal:  Negative for abdominal distention, abdominal pain, constipation, diarrhea, nausea and vomiting.   Genitourinary:  Negative for difficulty urinating and dysuria.   Skin:  Negative for  rash and wound.   Neurological:  Negative for dizziness, weakness and light-headedness.   Psychiatric/Behavioral:  Negative for agitation and behavioral problems.         Current Outpatient Medications   Medication Sig Dispense Refill    amLODIPine (NORVASC) 10 MG tablet Take 1 tablet by mouth daily 90 tablet 3    ferrous sulfate (IRON 325) 325 (65 Fe) MG tablet Take 1 tablet by mouth 2 times daily 180 tablet 1    omeprazole (PRILOSEC) 20 MG delayed release capsule TAKE 1 CAPSULE BY MOUTH EVERY DAY (Patient not taking: Reported on 10/19/2021) 30 capsule 0     No current facility-administered medications for this visit.       Objective     Vitals:    10/19/21 1614   BP: (!) 160/94   Pulse: 69   Temp: 98.4 F (36.9 C)   SpO2: 98%        Lab Results    Component Value Date    WBC 9.1 10/19/2021    HGB 10.6 (L) 10/19/2021    HCT 32.1 (L) 10/19/2021    MCV 72.9 (L) 10/19/2021    PLT 423 10/19/2021      Lab Results   Component Value Date    NA 136 10/19/2021    K 4.0 10/19/2021    CL 100 10/19/2021    CO2 26 10/19/2021    BUN 12 10/19/2021    CREATININE 0.7 10/19/2021    GLUCOSE 83 10/19/2021    CALCIUM 9.8 10/19/2021    PROT 8.7 (H) 10/19/2021    LABALBU 4.0 10/19/2021    BILITOT <0.2 10/19/2021    ALKPHOS 56 10/19/2021    AST 21 10/19/2021    ALT 11 10/19/2021    LABGLOM >60 10/19/2021    GFRAA >60 02/22/2020    AGRATIO 0.9 (L) 10/19/2021    GLOB 4.1 02/22/2020     Lab Results   Component Value Date    CHOL 146 02/22/2020     Lab Results   Component Value Date    TRIG 40 02/22/2020     Lab Results   Component Value Date    HDL 61 (H) 10/19/2021    HDL 51 02/22/2020     Lab Results   Component Value Date    LDLCALC 99 10/19/2021    LDLCALC 87 02/22/2020     Lab Results   Component Value Date    LABVLDL 16 10/19/2021    LABVLDL 8 02/22/2020     No results found for: [facility]  Lab Results   Component Value Date    LABA1C 5.8 10/19/2021     Lab Results   Component Value Date    EAG 119.8 10/19/2021           Physical Exam  Vitals and nursing note reviewed.   Constitutional:       General: She is not in acute distress.     Appearance: Normal appearance. She is well-developed. She is obese. She is not diaphoretic.   HENT:      Head: Normocephalic and atraumatic.   Eyes:      General: No scleral icterus.     Conjunctiva/sclera: Conjunctivae normal.      Pupils: Pupils are equal, round, and reactive to light.   Cardiovascular:      Rate and Rhythm: Normal rate and regular rhythm.      Pulses: Normal pulses.      Heart sounds: Normal heart sounds. No murmur  heard.    No friction rub. No gallop.   Pulmonary:      Effort: Pulmonary effort is normal. No respiratory distress.      Breath sounds: Normal breath sounds. No wheezing or rales.   Chest:      Chest wall: No  tenderness.   Abdominal:      General: Bowel sounds are normal. There is no distension.      Palpations: Abdomen is soft.      Tenderness: There is no abdominal tenderness. There is no guarding.   Musculoskeletal:         General: No tenderness or deformity. Normal range of motion.      Cervical back: Normal range of motion and neck supple.   Skin:     General: Skin is warm and dry.   Neurological:      Mental Status: She is alert and oriented to person, place, and time.      Cranial Nerves: No cranial nerve deficit.      Sensory: No sensory deficit.   Psychiatric:         Behavior: Behavior normal.            Please note that this chart was generated using dragon dictation software.  Although every effort was made to ensure the accuracy of this automated transcription, some errors in transcription may have occurred.       --Margarito Liner, MD

## 2021-10-19 NOTE — Assessment & Plan Note (Addendum)
Blood pressure is not at goal today  -Will have patient monitor blood pressure at home for 2-week, call with readings.  Titrate medication based on readings

## 2021-10-19 NOTE — Assessment & Plan Note (Signed)
Secondary to menorrhagia  Continue iron supplement

## 2021-10-19 NOTE — Assessment & Plan Note (Addendum)
Counseled patient  -She was started with lifestyle modification  -Also discussed possible medical therapy such as Reginal Lutes (her sister is on this medication with good result)

## 2021-10-19 NOTE — Assessment & Plan Note (Signed)
Was on OCP before but experienced irregular menstrual period  -Also has history of iron deficiency anemia  -Discussed other options of contraceptive including IUD, and implantation.  -We will have patient see gynecology

## 2021-10-20 LAB — CBC WITH AUTO DIFFERENTIAL
Basophils %: 0.5 %
Basophils Absolute: 0 10*3/uL (ref 0.0–0.2)
Eosinophils %: 2 %
Eosinophils Absolute: 0.2 10*3/uL (ref 0.0–0.6)
Hematocrit: 32.1 % — ABNORMAL LOW (ref 36.0–48.0)
Hemoglobin: 10.6 g/dL — ABNORMAL LOW (ref 12.0–16.0)
Lymphocytes %: 30.2 %
Lymphocytes Absolute: 2.8 10*3/uL (ref 1.0–5.1)
MCH: 24 pg — ABNORMAL LOW (ref 26.0–34.0)
MCHC: 32.9 g/dL (ref 31.0–36.0)
MCV: 72.9 fL — ABNORMAL LOW (ref 80.0–100.0)
MPV: 8.1 fL (ref 5.0–10.5)
Monocytes %: 4.7 %
Monocytes Absolute: 0.4 10*3/uL (ref 0.0–1.3)
Neutrophils %: 62.6 %
Neutrophils Absolute: 5.7 10*3/uL (ref 1.7–7.7)
Platelets: 423 10*3/uL (ref 135–450)
RBC: 4.41 M/uL (ref 4.00–5.20)
RDW: 16.7 % — ABNORMAL HIGH (ref 12.4–15.4)
WBC: 9.1 10*3/uL (ref 4.0–11.0)

## 2021-10-20 LAB — COMPREHENSIVE METABOLIC PANEL
ALT: 11 U/L (ref 10–40)
AST: 21 U/L (ref 15–37)
Albumin/Globulin Ratio: 0.9 — ABNORMAL LOW (ref 1.1–2.2)
Albumin: 4 g/dL (ref 3.4–5.0)
Alkaline Phosphatase: 56 U/L (ref 40–129)
Anion Gap: 10 (ref 3–16)
BUN: 12 mg/dL (ref 7–20)
CO2: 26 mmol/L (ref 21–32)
Calcium: 9.8 mg/dL (ref 8.3–10.6)
Chloride: 100 mmol/L (ref 99–110)
Creatinine: 0.7 mg/dL (ref 0.6–1.1)
Est, Glom Filt Rate: >60 (ref >60)
Glucose: 83 mg/dL (ref 70–99)
Potassium: 4 mmol/L (ref 3.5–5.1)
Sodium: 136 mmol/L (ref 136–145)
Total Bilirubin: <0.2 mg/dL (ref 0.0–1.0)
Total Protein: 8.7 g/dL — ABNORMAL HIGH (ref 6.4–8.2)

## 2021-10-20 LAB — SPECIMEN REJECTION

## 2021-10-20 LAB — LIPID, FASTING
Cholesterol, Fasting: 176 mg/dL (ref 0–199)
HDL: 61 mg/dL — ABNORMAL HIGH (ref 40–60)
LDL Calculated: 99 mg/dL (ref <100)
Triglyceride, Fasting: 79 mg/dL (ref 0–150)
VLDL Cholesterol Calculated: 16 mg/dL

## 2021-10-20 LAB — HEMOGLOBIN A1C
Hemoglobin A1C: 5.8 %
eAG: 119.8 mg/dL

## 2021-10-27 ENCOUNTER — Ambulatory Visit
Admit: 2021-10-27 | Discharge: 2021-10-27 | Payer: PRIVATE HEALTH INSURANCE | Attending: Obstetrics & Gynecology | Primary: Student in an Organized Health Care Education/Training Program

## 2021-10-27 DIAGNOSIS — Z01419 Encounter for gynecological examination (general) (routine) without abnormal findings: Secondary | ICD-10-CM

## 2021-10-27 NOTE — Progress Notes (Signed)
The sensitive parts of the examination were performed with Debra Pascua Santucci, MA as a chaperone.  Debra Griffin was present during the entirety of the sensitive parts of the examination.

## 2021-10-27 NOTE — Progress Notes (Signed)
SUBJECTIVE:  Debra Griffin is an 41 y.o. year old woman who presents for heavy menstruation with associated anemia.  She has a longstanding history of heavy menstruation with associated anemia.  The patient is looking for treatment at this point.    Debra Griffin is an 40 y.o. year old woman who presents for annual gyn exam.    REVIEW OF SYSTEMS:  No complaints of symptoms involving:  Constitutional: there has been no unanticipated weight loss. There's been no change in activity level. Negative for fever, chills.  Eyes: No visual changes, double vision, or scotomata. No scleral icterus.  HENT: No Headaches, hearing loss or vertigo. No sore throat, ear pain or nasal congestion  Respiratory: no cough or wheezing, no sputum production, no hemoptysis.    Gastrointestinal: No abdominal pain, appetite loss, blood in stools. No change in bowel habits.  Genitourinary: No dysuria, trouble voiding, or hematuria. No change in bladder habits.  Musculoskeletal:  No gait disturbance,no weakness or joint complaints.  Skin: No rash or pruritis.  Neurological: No headache, vision changes, change in muscle strength, numbness or tingling. No change in gait, balance, coordination.  Psychiatric: No anxiety, or depression. No change in mood or behavior.  Endocrine: No temperature intolerance. No excessive thirst, fluid intake, or urination. No tremor.  Hematologic/Lymphatic: No abnormal bruising or bleeding, blood clots or swollen lymph nodes.       Patient Active Problem List   Diagnosis    Anxiety    Primary hypertension    BMI 40.0-44.9, adult (HCC)    Iron deficiency anemia    Menorrhagia with regular cycle     Current Outpatient Medications   Medication Sig Dispense Refill    amLODIPine (NORVASC) 10 MG tablet Take 1 tablet by mouth daily 90 tablet 3    omeprazole (PRILOSEC) 20 MG delayed release capsule TAKE 1 CAPSULE BY MOUTH EVERY DAY (Patient not taking: Reported on 10/19/2021) 30 capsule 0    ferrous sulfate (IRON 325) 325 (65  Fe) MG tablet Take 1 tablet by mouth 2 times daily 180 tablet 1     No current facility-administered medications for this visit.     Past Medical History:   Diagnosis Date    Anxiety     Headache     Hypertension     Irritable bowel syndrome     Tuberculosis 1998     Past Surgical History:   Procedure Laterality Date    CESAREAN SECTION      x2    EYE SURGERY Left 1996    abcess drainage behind eye    LYMPH NODE BIOPSY      + TB     Social History     Tobacco Use    Smoking status: Never    Smokeless tobacco: Never   Substance Use Topics    Alcohol use: Not Currently     OB History       Gravida   2    Para   2    Term   2    Preterm        AB        Living             SAB        IAB        Ectopic        Molar        Multiple        Live Births  Family History   Problem Relation Age of Onset    Diabetes Mother     Hypertension Mother     Kidney Disease Mother     No Known Problems Father     Hypertension Sister     Hypertension Brother     No Known Problems Brother     No Known Problems Brother     Heart Failure Maternal Grandmother     Heart Attack Maternal Grandmother     Diabetes Maternal Grandfather        OBJECTIVE:  BP (!) 154/88   Pulse 72   Temp 97.6 F (36.4 C) (Temporal)   Resp 12   Wt 300 lb (136.1 kg)   LMP 10/12/2021 (Exact Date)   SpO2 99%   BMI 43.05 kg/m   Body mass index is 43.05 kg/m.  General appearance: alert,calm,pleasant, no acute distress, non-toxic  Skin:  no lesions,no rashes  Neck: no thyromegaly,no adenopathy,no masses  Abdominal: Abdomen soft, non-tender. No rebound or guarding. No masses, organomegaly  Breasts: Inspection negative. No skin changes such as dipples, edema, or retractions. No nipple discharge or bleeding. No mass palpated. Non tender. No supraclavicular, infraclavicular, or axillary lymphadenopathy  Genitourinary:  Vulva:  no external lesions,normal hair distribution,no inguinal adenopathy  Vagina:  moist,pink,well rugated,no discharge  Bladder  without mass, nontender.  Urethra, and Urethral meatus grossly normal without mass and nontender  Cervix:  smooth,pink,no lesions.  Uterus:  normal size, shape and consistency,mobile,nontender  Adnexa:  no masses,no tenderness  Rectum/anus:  no external hemorrhoids,no lesions  History and Examination chaperoned by Medical Assistant    ASSESSMENT AND PLAN:   Diagnosis Orders   1. Well woman exam with routine gynecological exam  PAP SMEAR      2. Menorrhagia with regular cycle  US PELVIS COMPLETE      3. Iron deficiency anemia due to chronic blood loss          Menorrhagia with associated anemia.  Her last hemoglobin was 10.  She has been as low as 8.  Management options are reviewed.  Options included expectant management, medical management such as with oral contraceptive pills, and other medical options such as Mirena IUD.  Surgical options such as endometrial ablation were reviewed as well.  We discussed checking a pelvic ultrasound to further evaluate her uterus and having her meet with [place] afterwards to discuss results and decide on treatment.    SBE monthly and return annually or prn  Explained current pap smear guidelines  Patient counseling:  SBE demonstrated   Instructed on getting appointment for mammogram

## 2021-10-28 LAB — HUMAN PAPILLOMAVIRUS (HPV) DNA PROBE THIN PREP HIGH RISK
HPV Genotype 16: NOT DETECTED
HPV Type 18: NOT DETECTED
HPVOH (OTHER TYPES): NOT DETECTED

## 2021-11-03 ENCOUNTER — Encounter
Payer: PRIVATE HEALTH INSURANCE | Attending: Obstetrics & Gynecology | Primary: Student in an Organized Health Care Education/Training Program

## 2022-02-09 ENCOUNTER — Encounter
Admit: 2022-02-09 | Discharge: 2022-02-09 | Payer: PRIVATE HEALTH INSURANCE | Attending: Student in an Organized Health Care Education/Training Program | Primary: Student in an Organized Health Care Education/Training Program

## 2022-02-09 DIAGNOSIS — Z6841 Body Mass Index (BMI) 40.0 and over, adult: Secondary | ICD-10-CM

## 2022-02-09 MED ORDER — FERROUS SULFATE 325 (65 FE) MG PO TABS
325 (65 Fe) MG | ORAL_TABLET | Freq: Two times a day (BID) | ORAL | 3 refills | Status: AC
Start: 2022-02-09 — End: ?

## 2022-02-09 MED ORDER — WEGOVY 0.25 MG/0.5ML SC SOAJ
0.250.5 MG/0.5ML | SUBCUTANEOUS | 0 refills | Status: DC
Start: 2022-02-09 — End: 2023-03-03

## 2022-02-09 NOTE — Progress Notes (Signed)
Debra Griffin (DOB:  [birth year]) is a 41 y.o. female here for evaluation of the following chief complaint(s):  Weight Management (Refill needed also, hard  to focus all even more @ work  x 6 mos.)         ASSESSMENT/PLAN:  1. BMI 40.0-44.9, adult [facility])  Assessment & Plan:  Failed modification including calorie restriction diet and exercise  -Start Wegovy  Orders:  -     Semaglutide-Weight Management (WEGOVY) 0.25 MG/0.5ML SOAJ SC injection; Inject 0.25 mg into the skin every 7 days, Disp-2 mL, R-0Normal  2. Iron deficiency anemia, unspecified iron deficiency anemia type  -     ferrous sulfate (IRON 325) 325 (65 Fe) MG tablet; Take 1 tablet by mouth 2 times daily, Disp-180 tablet, R-3Normal      Return in about 4 weeks (around 03/09/2022) for Weight.         Subjective   SUBJECTIVE/OBJECTIVE:  HPI  Presented today to discuss on weight management. Has tried different diet and exercise without any success. Reports that her sister and friend are doing well with Wegovy. Concern of potential hair thinning associated with Wegovy.     Review of Systems:   Constitutional:  Denies fever or chills   Eyes:  Denies change in visual acuity   HENT:  Denies nasal congestion or sore throat   Respiratory:  Denies cough or shortness of breath   Cardiovascular:  Denies chest pain or edema   GI:  Denies abdominal pain, nausea, vomiting, bloody stools or diarrhea   GU:  Denies dysuria   Musculoskeletal:  Denies back pain or joint pain   Integument:  Denies rash   Neurologic:  Denies headache, focal weakness or sensory changes   Endocrine:  Denies polyuria or polydipsia   Lymphatic:  Denies swollen glands   Psychiatric:  Denies depression or anxiety        Current Outpatient Medications   Medication Sig Dispense Refill    ferrous sulfate (IRON 325) 325 (65 Fe) MG tablet Take 1 tablet by mouth 2 times daily 180 tablet 3    Semaglutide-Weight Management (WEGOVY) 0.25 MG/0.5ML SOAJ SC injection Inject 0.25 mg into the skin every 7 days 2 mL  0    amLODIPine (NORVASC) 10 MG tablet Take 1 tablet by mouth daily 90 tablet 3    omeprazole (PRILOSEC) 20 MG delayed release capsule TAKE 1 CAPSULE BY MOUTH EVERY DAY (Patient not taking: Reported on 10/19/2021) 30 capsule 0     No current facility-administered medications for this visit.       Objective       Lab Results   Component Value Date    WBC 9.1 10/19/2021    HGB 10.6 (L) 10/19/2021    HCT 32.1 (L) 10/19/2021    MCV 72.9 (L) 10/19/2021    PLT 423 10/19/2021      Lab Results   Component Value Date    NA 136 10/19/2021    K 4.0 10/19/2021    CL 100 10/19/2021    CO2 26 10/19/2021    BUN 12 10/19/2021    CREATININE 0.7 10/19/2021    GLUCOSE 83 10/19/2021    CALCIUM 9.8 10/19/2021    PROT 8.7 (H) 10/19/2021    LABALBU 4.0 10/19/2021    BILITOT <0.2 10/19/2021    ALKPHOS 56 10/19/2021    AST 21 10/19/2021    ALT 11 10/19/2021    LABGLOM >60 10/19/2021    GFRAA >60 02/22/2020  AGRATIO 0.9 (L) 10/19/2021    GLOB 4.1 02/22/2020     Lab Results   Component Value Date    CHOL 146 02/22/2020     Lab Results   Component Value Date    TRIG 40 02/22/2020     Lab Results   Component Value Date    HDL 61 (H) 10/19/2021    HDL 51 02/22/2020     Lab Results   Component Value Date    LDLCALC 99 10/19/2021    LDLCALC 87 02/22/2020     Lab Results   Component Value Date    LABVLDL 16 10/19/2021    LABVLDL 8 02/22/2020     No results found for: "CHOLHDLRATIO"  Lab Results   Component Value Date    LABA1C 5.8 10/19/2021     Lab Results   Component Value Date    EAG 119.8 10/19/2021         Physical Exam:  Vital Signs: BP 138/80   Pulse 80   Temp 98 F (36.7 C)   Ht 5\' 10"  (1.778 m)   Wt 293 lb (132.9 kg)   SpO2 100%   BMI 42.04 kg/m   Constitutional:  Well developed, well nourished, no acute distress, non-toxic appearance   Eyes:  PERRL, conjunctiva normal   HENT:  Atraumatic, external ears normal, nose normal, oropharynx moist, no pharyngeal exudates. Neck- normal range of motion, no tenderness, supple   Respiratory:   No respiratory distress, normal breath sounds, no rales, no wheezing   Cardiovascular:  Normal rate, normal rhythm, no murmurs, no gallops, no rubs   GI:  Soft, nondistended, normal bowel sounds, nontender, no organomegaly, no mass, no rebound, no guarding   GU:  No costovertebral angle tenderness   Musculoskeletal:  No edema, no tenderness, no deformities. Back- no tenderness  Integument:  Well hydrated, no rash   Lymphatic:  No lymphadenopathy noted   Neurologic:  Alert & oriented x 3, CN 2-12 normal, normal motor function, normal sensory function, no focal deficits noted   Psychiatric:  Speech and behavior appropriate          Please note that this chart was generated using dragon dictation software.  Although every effort was made to ensure the accuracy of this automated transcription, some errors in transcription may have occurred.       -- , MD

## 2022-02-09 NOTE — Assessment & Plan Note (Addendum)
Failed modification including calorie restriction diet and exercise  -Start [id number].  Discussed medication, potential side effect, chronic therapy

## 2022-02-18 NOTE — Telephone Encounter (Signed)
Submitted PA for [employer] 0.25MG /0.5ML auto-injectors  Via CMM Key: [id number] STATUS: PENDING.    Follow up done daily; if no response in three days we will refax for status check.  If another three days goes by with no response we will call the insurance for status.

## 2022-02-18 NOTE — Telephone Encounter (Signed)
Semaglutide-Weight Management (WEGOVY) 0.25 MG/0.5ML SOAJ SC injection [[id number]]     Order Details  Dose: 0.25 mg Route: SubCUTAneous Frequency: EVERY 7 DAYS   Dispense Quantity: 2 mL Refills: 0          Sig: Inject 0.25 mg into the skin every 7 days         Associated Diagnoses: BMI 40.0-44.9, adult (HCC) [Z68.41]          Please send a PA to patients insurance for above meds

## 2022-02-19 NOTE — Telephone Encounter (Signed)
RECEIVED MESSAGE PROVIDER NOT FOUND. RESENT.     Submitted PA for Wegovy 0.25MG /0.5ML auto-injectors  Via CMM Key: [id number] STATUS: PENDING.    Follow up done daily; if no response in three days we will refax for status check.  If another three days goes by with no response we will call the insurance for status.

## 2022-02-22 NOTE — Telephone Encounter (Signed)
Per [employer]: We received your request for prior authorization for [facility] INJ 0.25MG , for the above member; however, [employer] has a denied request on file for [employer] 0.25MG  for this member. Please follow the appeals process outlined in the original denial or contact Prior Authorization Department at [phone] for further questions. Plan Excluded Drug; letter attached.

## 2022-04-26 ENCOUNTER — Encounter
Payer: PRIVATE HEALTH INSURANCE | Attending: Student in an Organized Health Care Education/Training Program | Primary: Student in an Organized Health Care Education/Training Program

## 2023-01-26 ENCOUNTER — Encounter

## 2023-01-27 MED ORDER — AMLODIPINE BESYLATE 10 MG PO TABS
10 | ORAL_TABLET | Freq: Every day | ORAL | 0 refills | Status: DC
Start: 2023-01-27 — End: 2023-03-03

## 2023-01-27 NOTE — Telephone Encounter (Signed)
 Last OV: 07/27/21  Next OV: as needed basis   Last refill: 07/27/21  #90 3 R/F  Most recent Labs:   Last EKG (if needed):    Called spoke with patient states would like to get a 30 day supply. She will have her PCP refill any additional refills.

## 2023-03-03 ENCOUNTER — Ambulatory Visit
Admit: 2023-03-03 | Discharge: 2023-03-03 | Payer: PRIVATE HEALTH INSURANCE | Attending: Student in an Organized Health Care Education/Training Program | Primary: Student in an Organized Health Care Education/Training Program

## 2023-03-03 DIAGNOSIS — Z6841 Body Mass Index (BMI) 40.0 and over, adult: Secondary | ICD-10-CM

## 2023-03-03 MED ORDER — AMLODIPINE BESYLATE 10 MG PO TABS
10 | ORAL_TABLET | Freq: Every day | ORAL | 1 refills | Status: AC
Start: 2023-03-03 — End: ?

## 2023-03-03 MED ORDER — WEGOVY 0.25 MG/0.5ML SC SOAJ
0.25 MG/0.5ML | SUBCUTANEOUS | 0 refills | Status: DC
Start: 2023-03-03 — End: 2023-04-18

## 2023-03-03 NOTE — Progress Notes (Signed)
Well Adult Note  Name: Debra Griffin Date: 03/03/2023   MRN: [id number] Sex: Female   Age: 42 y.o. Ethnicity: Non-Hispanic / Non Latino   DOB: 12/12/1980 Race: Black / African American      Debra Griffin is here for a well adult exam.       Subjective   History:  She is doing well.  Interested in restarting Wegovy.  Was not able to started last time due to cost.  Now has new insurance.   She also recently joining a running-walking club, planning to do regular exercise 2 to 3 times a week.   Had biometric lab at work.  Reports that cholesterol was high.  No report available to review today.  She was advised to provide [place] a copy to review    Review of Systems   Constitutional:  Negative for activity change, appetite change, chills, diaphoresis, fatigue, fever and unexpected weight change.   HENT:  Negative for trouble swallowing and voice change.    Eyes:  Negative for photophobia and visual disturbance.   Respiratory:  Negative for apnea, cough, choking, chest tightness, shortness of breath, wheezing and stridor.    Cardiovascular:  Negative for chest pain, palpitations and leg swelling.   Gastrointestinal:  Negative for abdominal distention, abdominal pain, constipation, diarrhea, nausea and vomiting.   Genitourinary:  Negative for difficulty urinating and dysuria.   Skin:  Negative for rash and wound.   Neurological:  Negative for dizziness, weakness and light-headedness.   Psychiatric/Behavioral:  Negative for agitation and behavioral problems.        No Known Allergies  Prior to Visit Medications    Medication Sig Taking? Authorizing Provider   Semaglutide-Weight Management (WEGOVY) 0.25 MG/0.5ML SOAJ SC injection Inject 0.25 mg into the skin every 7 days Yes Margarito Liner, MD   amLODIPine (NORVASC) 10 MG tablet Take 1 tablet by mouth daily Yes Margarito Liner, MD   ferrous sulfate (IRON 325) 325 (65 Fe) MG tablet Take 1 tablet by mouth 2 times daily  Odette Horns L, MD   omeprazole (PRILOSEC) 20 MG  delayed release capsule TAKE 1 CAPSULE BY MOUTH EVERY DAY  Patient not taking: Reported on 10/19/2021  Billow, Ardyth Gal, APRN - CNP     Past Medical History:   Diagnosis Date    Anxiety     Headache     Hypertension     Irritable bowel syndrome     Tuberculosis 1998     Past Surgical History:   Procedure Laterality Date    CESAREAN SECTION      x2    EYE SURGERY Left 1996    abcess drainage behind eye    LYMPH NODE BIOPSY      + TB     Family History   Problem Relation Age of Onset    Diabetes Mother     Hypertension Mother     Kidney Disease Mother     No Known Problems Father     Hypertension Sister     Hypertension Brother     No Known Problems Brother     No Known Problems Brother     Heart Failure Maternal Grandmother     Heart Attack Maternal Grandmother     Diabetes Maternal Grandfather      Social History     Tobacco Use    Smoking status: Never    Smokeless tobacco: Never   Vaping Use    Vaping status:  Never Used   Substance Use Topics    Alcohol use: Not Currently    Drug use: Never           Objective   Vital Signs  BP 136/88   Pulse 82   Temp 97.7 F (36.5 C)   Ht 1.785 m (5' 10.28")   Wt 133.2 kg (293 lb 9.6 oz)   SpO2 99%   BMI 41.80 kg/m     Wt Readings from Last 3 Encounters:   03/03/23 133.2 kg (293 lb 9.6 oz)   02/09/22 132.9 kg (293 lb)   10/27/21 136.1 kg (300 lb)       Physical Exam  Vitals and nursing note reviewed.   Constitutional:       General: She is not in acute distress.     Appearance: Normal appearance. She is well-developed. She is not ill-appearing, toxic-appearing or diaphoretic.   HENT:      Head: Normocephalic and atraumatic.   Eyes:      General: No scleral icterus.     Conjunctiva/sclera: Conjunctivae normal.      Pupils: Pupils are equal, round, and reactive to light.   Cardiovascular:      Rate and Rhythm: Normal rate and regular rhythm.      Heart sounds: Normal heart sounds. No murmur heard.     No friction rub. No gallop.   Pulmonary:      Effort: Pulmonary effort is  normal. No respiratory distress.      Breath sounds: Normal breath sounds. No wheezing or rales.   Chest:      Chest wall: No tenderness.   Abdominal:      General: Bowel sounds are normal. There is no distension.      Palpations: Abdomen is soft.      Tenderness: There is no abdominal tenderness. There is no guarding.   Musculoskeletal:         General: No tenderness or deformity. Normal range of motion.      Cervical back: Normal range of motion and neck supple.   Skin:     General: Skin is warm and dry.   Neurological:      Mental Status: She is alert and oriented to person, place, and time.      Cranial Nerves: No cranial nerve deficit.      Sensory: No sensory deficit.   Psychiatric:         Behavior: Behavior normal.             Assessment & Plan   BMI 40.0-44.9, adult  Assessment & Plan:   Patient was asked about her current diet and exercise habits, and personalized advice was provided regarding recommended lifestyle changes.  Patient's comorbid health conditions associated with elevated BMI were discussed, including dyslipidemia and hypertension, as well as the likely benefits of weight loss.  Based upon patient's motivation to change her behavior, the following plan was agreed upon to work toward a weight loss goal of 90 pounds: low carbohydrate diet, exercise for at least 30 minutes 4-5 days per week, and medication prescribed: Wegovy . Educational materials for  weight loss were provided.  Patient will follow-up in 3 month(s) with PCP.  Provider spent 20 minutes counseling patient.    Orders:  -     Semaglutide-Weight Management (WEGOVY) 0.25 MG/0.5ML SOAJ SC injection; Inject 0.25 mg into the skin every 7 days, Disp-2 mL, R-0Normal  Essential hypertension  Assessment & Plan:   Well controlled  Doing well with Amlodipine 10 mg - continue with the same  Orders:  -     amLODIPine (NORVASC) 10 MG tablet; Take 1 tablet by mouth daily, Disp-90 tablet, R-1DX Code Needed  .Normal  Encounter for well adult exam  without abnormal findings  Assessment & Plan:  Overall she is doing well.  Reports that she has biometric labs done at work however no report available for review.  Requested a copy.   Discussed vaccine, patient declined.          Return in about 3 months (around 06/03/2023) for 3 months if starting wegovy. If not then follow up with me in 6 months .     Personalized Preventive Plan  Current Health Maintenance Status  Immunization History   Administered Date(s) Administered    COVID-19, PFIZER GRAY top, DO NOT Dilute, (age 78 y+), IM, 30 mcg/0.3 mL 08/29/2020    COVID-19, PFIZER PURPLE top, DILUTE for use, (age 51 y+), 63mcg/0.3mL 06/28/2019, 07/19/2019        Health Maintenance Due   Topic Date Due    Varicella vaccine (1 of 2 - 13+ 2-dose series) Never done    Hepatitis B vaccine (1 of 3 - 19+ 3-dose series) Never done    DTaP/Tdap/Td vaccine (1 - Tdap) Never done    Breast cancer screen  Never done    Depression Screen  07/07/2022    A1C test (Diabetic or Prediabetic)  10/20/2022    Flu vaccine (1) Never done    COVID-19 Vaccine (4 - 2023-24 season) 01/16/2023     Recommendations for Preventive Services Due: see orders and patient instructions/AVS.

## 2023-03-03 NOTE — Assessment & Plan Note (Signed)
Patient was asked about her current diet and exercise habits, and personalized advice was provided regarding recommended lifestyle changes.  Patient's comorbid health conditions associated with elevated BMI were discussed, including dyslipidemia and hypertension, as well as the likely benefits of weight loss.  Based upon patient's motivation to change her behavior, the following plan was agreed upon to work toward a weight loss goal of 90 pounds: low carbohydrate diet, exercise for at least 30 minutes 4-5 days per week, and medication prescribed: Wegovy . Educational materials for  weight loss were provided.  Patient will follow-up in 3 month(s) with PCP.  Provider spent 20 minutes counseling patient.

## 2023-03-03 NOTE — Assessment & Plan Note (Signed)
Well controlled  Doing well with Amlodipine 10 mg - continue with the same

## 2023-03-03 NOTE — Patient Instructions (Signed)
Well Visit, Ages 78 to 82: Care Instructions  Well visits can help you stay healthy. Your doctor has checked your overall health and may have suggested ways to take good care of yourself. Your doctor also may have recommended tests. You can help prevent illness with healthy eating, good sleep, vaccinations, regular exercise, and other steps.    Get the tests that you and your doctor decide on. Depending on your age and risks, examples might include screening for diabetes; hepatitis C; HIV; and cervical, breast, lung, and colon cancer. Screening helps find diseases before any symptoms appear.   Eat healthy foods. Choose fruits, vegetables, whole grains, lean protein, and low-fat dairy foods. Limit saturated fat and reduce salt.     Limit alcohol. Men should have no more than 2 drinks a day. Women should have no more than 1. For some people, no alcohol is the best choice.   Exercise. Get at least 30 minutes of exercise on most days of the week. Walking can be a good choice.     Reach and stay at your healthy weight. This will lower your risk for many health problems.   Take care of your mental health. Try to stay connected with friends, family, and community, and find ways to manage stress.     If you're feeling depressed or hopeless, talk to someone. A counselor can help. If you don't have a counselor, talk to your doctor.   Talk to your doctor if you think you may have a problem with alcohol or drug use. This includes prescription medicines, marijuana, and other drugs.     Avoid tobacco and nicotine: Don't smoke, vape, or chew. If you need help quitting, talk to your doctor.   Practice safer sex. Getting tested, using condoms or dental dams, and limiting sex partners can help prevent STIs.     Use birth control if it's important to you to prevent pregnancy. Talk with your doctor about your choices and what might be best for you.   Prevent problems where you can. Protect your skin from too much sun, wash your  hands, brush your teeth twice a day, and wear a seat belt in the car.   Where can you learn more?  Go to [employer] and enter P072 to learn more about "Well Visit, Ages 20 to 24: Care Instructions."  Current as of: December 20, 2021  Content Version: 14.2   [address], [place].   Care instructions adapted under license by [facility]. If you have questions about a medical condition or this instruction, always ask your healthcare professional. [employer] disclaims any warranty or liability for your use of this information.

## 2023-03-03 NOTE — Assessment & Plan Note (Signed)
Overall she is doing well.  Reports that she has biometric labs done at work however no report available for review.  Requested a copy.   Discussed vaccine, patient declined.

## 2023-03-17 NOTE — Telephone Encounter (Signed)
Pt states her insurance does not cover the [facility]. Pt states she received samples from our office and has started the samples.  Pt can not effort the [facility] on her own. Not sure what to do now    [phone]  Pls call and advise

## 2023-03-17 NOTE — Telephone Encounter (Signed)
She can check with the insurance to see if they provide any alternatives to [facility]. We can consider the alternative. Otherwise, we can consider weight management referral.

## 2023-03-18 NOTE — Telephone Encounter (Signed)
[phone] (home)   PATIENT INFORMED  Left message on machine  : THAT DR NGUYEN   SHE NEEDS TO CALL INS. CO. FOR (ALT)  DR NGUYEN WILL NOT ORDER COMPOUND MEDS.

## 2023-03-18 NOTE — Telephone Encounter (Signed)
Pt states she is on Wegovy to expensive and would like the compound version and sent to       [facility]  [address]  [place], [place] [address]  [phone]    [phone]  Pls call and advise

## 2023-03-18 NOTE — Telephone Encounter (Signed)
[phone] (home)   Left message on machine  CHECK INS.

## 2023-03-21 ENCOUNTER — Encounter

## 2023-03-21 NOTE — Telephone Encounter (Signed)
Repeat labs. Orders are in chart. If reasonable then we can consider to start either Mounjaro or Ozempic

## 2023-03-21 NOTE — Telephone Encounter (Signed)
LVM for pt to have fasting labs done and Dr Cyndie Chime will determine the best treatment for her.

## 2023-03-21 NOTE — Telephone Encounter (Signed)
Pt states that her insurance says they cover Munjarno and ozempic   Pt req on of these be prescribed     Please call if need be     Pharm- [id number] - [place], [place] - [address] [place] [phone] - F [phone]

## 2023-03-24 ENCOUNTER — Encounter

## 2023-03-25 LAB — LIPID, FASTING
Cholesterol, Fasting: 158 mg/dL (ref 0–199)
HDL: 50 mg/dL (ref 40–60)
LDL Cholesterol: 98 mg/dL (ref <100)
Triglyceride, Fasting: 51 mg/dL (ref 0–150)
VLDL Cholesterol Calculated: 10 mg/dL

## 2023-03-25 LAB — CBC WITH AUTO DIFFERENTIAL
Basophils %: 0.5 %
Basophils Absolute: 0 10*3/uL (ref 0.0–0.2)
Eosinophils %: 3.6 %
Eosinophils Absolute: 0.3 10*3/uL (ref 0.0–0.6)
Hematocrit: 34.6 % — ABNORMAL LOW (ref 36.0–48.0)
Hemoglobin: 10.7 g/dL — ABNORMAL LOW (ref 12.0–16.0)
Lymphocytes %: 31 %
Lymphocytes Absolute: 2.5 10*3/uL (ref 1.0–5.1)
MCH: 23.2 pg — ABNORMAL LOW (ref 26.0–34.0)
MCHC: 30.8 g/dL — ABNORMAL LOW (ref 31.0–36.0)
MCV: 75.3 fL — ABNORMAL LOW (ref 80.0–100.0)
MPV: 8 fL (ref 5.0–10.5)
Monocytes %: 3.9 %
Monocytes Absolute: 0.3 10*3/uL (ref 0.0–1.3)
Neutrophils %: 61 %
Neutrophils Absolute: 4.8 10*3/uL (ref 1.7–7.7)
Platelets: 426 10*3/uL (ref 135–450)
RBC: 4.6 M/uL (ref 4.00–5.20)
RDW: 17.7 % — ABNORMAL HIGH (ref 12.4–15.4)
WBC: 7.9 10*3/uL (ref 4.0–11.0)

## 2023-03-25 LAB — COMPREHENSIVE METABOLIC PANEL
ALT: 16 U/L (ref 10–40)
AST: 22 U/L (ref 15–37)
Albumin/Globulin Ratio: 1 — ABNORMAL LOW (ref 1.1–2.2)
Albumin: 3.9 g/dL (ref 3.4–5.0)
Alkaline Phosphatase: 55 U/L (ref 40–129)
Anion Gap: 8 (ref 3–16)
BUN: 11 mg/dL (ref 7–20)
CO2: 27 mmol/L (ref 21–32)
Calcium: 9.5 mg/dL (ref 8.3–10.6)
Chloride: 105 mmol/L (ref 99–110)
Creatinine: 0.8 mg/dL (ref 0.6–1.1)
Est, Glom Filt Rate: >90 (ref >60)
Glucose: 78 mg/dL (ref 70–99)
Potassium: 4.5 mmol/L (ref 3.5–5.1)
Sodium: 140 mmol/L (ref 136–145)
Total Bilirubin: 0.3 mg/dL (ref 0.0–1.0)
Total Protein: 7.9 g/dL (ref 6.4–8.2)

## 2023-03-25 LAB — HEMOGLOBIN A1C
Estimated Avg Glucose: 111.2 mg/dL
Hemoglobin A1C: 5.5 %

## 2023-03-25 LAB — TSH WITH REFLEX: TSH: 1.36 u[IU]/mL (ref 0.27–4.20)

## 2023-03-25 LAB — IRON AND TIBC
Iron % Saturation: 6 % — ABNORMAL LOW (ref 15–50)
Iron: 18 ug/dL — ABNORMAL LOW (ref 37–145)
TIBC: 310 ug/dL (ref 260–445)

## 2023-03-25 LAB — FERRITIN: Ferritin: 10.5 ng/mL — ABNORMAL LOW (ref 15.0–150.0)

## 2023-03-30 ENCOUNTER — Encounter
Payer: PRIVATE HEALTH INSURANCE | Attending: Student in an Organized Health Care Education/Training Program | Primary: Student in an Organized Health Care Education/Training Program

## 2023-04-18 ENCOUNTER — Encounter
Admit: 2023-04-18 | Payer: PRIVATE HEALTH INSURANCE | Admitting: Student in an Organized Health Care Education/Training Program | Primary: Student in an Organized Health Care Education/Training Program

## 2023-04-18 VITALS — BP 132/84 | HR 91 | Temp 98.40000°F | Ht 69.29 in | Wt 292.0 lb

## 2023-04-18 DIAGNOSIS — Z0000 Encounter for general adult medical examination without abnormal findings: Principal | ICD-10-CM

## 2023-04-18 MED ORDER — OMEPRAZOLE 20 MG PO CPDR
20 MG | ORAL_CAPSULE | Freq: Every day | ORAL | 1 refills | Status: AC
Start: 2023-04-18 — End: 2023-05-30

## 2023-04-18 NOTE — Patient Instructions (Signed)
 Well Visit, Ages 71 to 70: Care Instructions  Well visits can help you stay healthy. Your doctor has checked your overall health and may have suggested ways to take good care of yourself. Your doctor also may have recommended tests. You can help preve

## 2023-04-18 NOTE — Assessment & Plan Note (Signed)
 Stable but continues to be anemic. Was seen by heme in the past had work up before. Had prior transfusion.   Secondary to menorrhagia  Continue iron supplement  Discussed follow up with GYN

## 2023-04-18 NOTE — Assessment & Plan Note (Addendum)
 Within normal limits for age- cont to work no ADL issues,immunizations up to date, no depression ,no cognitive impairment  Mammogram discussed - start screening   Pap smear up to date, last done in 2023  Eye exam up to date  Findings and recommendations di

## 2023-04-18 NOTE — Progress Notes (Signed)
 Well Adult Note  Name: Debra Griffin Date: 04/18/2023   MRN: [id number] Sex: Female   Age: 42 y.o. Ethnicity: Non-Hispanic / Non Latino   DOB: [birth year] Race: Black / African American      Debra Griffin is here for a well adult exam.  Chief Compla

## 2023-04-18 NOTE — Assessment & Plan Note (Addendum)
 Endorses many ADHD sx. Will have patient see psych to ADHD testing. Return to discuss treatment if indicated

## 2023-04-18 NOTE — Assessment & Plan Note (Signed)
Well controlled  Doing well with Amlodipine 10 mg - continue with the same

## 2023-04-18 NOTE — Assessment & Plan Note (Signed)
 Patient was asked about her current diet and exercise habits, and personalized advice was provided regarding recommended lifestyle changes.  Patient's comorbid health conditions associated with elevated BMI were discussed, including dyslipidemia and hyper

## 2023-04-23 ENCOUNTER — Encounter: Admit: 2023-04-23 | Admitting: Student in an Organized Health Care Education/Training Program

## 2023-04-23 DIAGNOSIS — D509 Iron deficiency anemia, unspecified: Secondary | ICD-10-CM

## 2023-04-25 MED ORDER — FERROUS SULFATE 325 (65 FE) MG PO TABS
325 | ORAL_TABLET | Freq: Two times a day (BID) | ORAL | 1 refills | Status: AC
Start: 2023-04-25 — End: ?

## 2023-05-09 ENCOUNTER — Ambulatory Visit
Admit: 2023-05-09 | Discharge: 2023-05-09 | Payer: PRIVATE HEALTH INSURANCE | Attending: Clinical | Admitting: Clinical | Primary: Student in an Organized Health Care Education/Training Program

## 2023-05-09 DIAGNOSIS — F902 Attention-deficit hyperactivity disorder, combined type: Principal | ICD-10-CM

## 2023-05-09 NOTE — Progress Notes (Signed)
 Behavioral Health Consultation   Vale Haven, PhD  Clinical Psychologist  05/09/2023      Time spent with Patient: 20 minutes  11:40-12:00  This is patient's 1st  [facility] appointment.    Reason for Consult:   Chief Complaint   Patient presents with    [facility]

## 2023-05-30 ENCOUNTER — Ambulatory Visit
Admit: 2023-05-30 | Discharge: 2023-05-30 | Payer: PRIVATE HEALTH INSURANCE | Attending: Student in an Organized Health Care Education/Training Program | Primary: Student in an Organized Health Care Education/Training Program

## 2023-05-30 VITALS — BP 136/84 | HR 67 | Temp 97.70000°F | Ht 69.0 in | Wt 299.0 lb

## 2023-05-30 DIAGNOSIS — F988 Other specified behavioral and emotional disorders with onset usually occurring in childhood and adolescence: Secondary | ICD-10-CM

## 2023-05-30 MED ORDER — OMEPRAZOLE 20 MG PO CPDR
20 | ORAL_CAPSULE | Freq: Every day | ORAL | 1 refills | Status: AC
Start: 2023-05-30 — End: ?

## 2023-05-30 MED ORDER — AMPHETAMINE-DEXTROAMPHETAMINE 10 MG PO TABS
10 | ORAL_TABLET | Freq: Every day | ORAL | 0 refills | Status: AC
Start: 2023-05-30 — End: 2023-06-29

## 2023-05-30 NOTE — Assessment & Plan Note (Signed)
Start Adderall 10 mg.  I discussed with patient about the medication, potential side effect, goal of therapy  She will let me know how she does with this dose.  Adjust Rx if needed  PDMP reviewed  Controlled substance patient agreement signed today

## 2023-05-30 NOTE — Progress Notes (Signed)
Debra Griffin (DOB:  04-24-1981) is a 43 y.o. female here for evaluation of the following chief complaint(s):  Medication Check (Review ozempic compound )      Assessment & Plan   ASSESSMENT/PLAN:  1. Attention deficit disorder (ADD) in adult  Assessment & Plan:  Start Adderall 10 mg.  I discussed with patient about the medication, potential side effect, goal of therapy  She will let me know how she does with this dose.  Adjust Rx if needed  PDMP reviewed  Controlled substance patient agreement signed today    Orders:  -     amphetamine-dextroamphetamine (ADDERALL, 10MG ,) 10 MG tablet; Take 1 tablet by mouth daily for 30 days. Max Daily Amount: 10 mg, Disp-30 tablet, R-0Normal  2. Nausea  -     omeprazole (PRILOSEC) 20 MG delayed release capsule; Take 1 capsule by mouth Daily, Disp-90 capsule, R-1Normal  3. BMI 40.0-44.9, adult  Assessment & Plan:   Patient was asked about her current diet and exercise habits, and personalized advice was provided regarding recommended lifestyle changes.  Patient's comorbid health conditions associated with elevated BMI were discussed, including dyslipidemia and hypertension, as well as the likely benefits of weight loss.  Based upon patient's motivation to change her behavior, the following plan was agreed upon to work toward a weight loss goal of 90 pounds: low carbohydrate diet, exercise for at least 30 minutes 4-5 days per week, and medication prescribed: [place] but insurance not covered . Educational materials for  weight loss were provided.  Patient will follow-up in 3 month(s) with PCP.  Provider spent 20 minutes counseling patient.    Insurance does not cover weight loss med.   Referred to weight management in the past but did not want to start it.  She has been working on increased exercise healthy diet      Return in about 3 months (around 08/28/2023) for Routine follow-up, ADHD, Weight.         Subjective   SUBJECTIVE/OBJECTIVE:  HPI  Debra Griffin Is here for a follow up visit.  She is doing well.   Insurance did not cover any medications.   She has been exercise   She saw psychiatry, completed ADHD screening and had very positive result reflecting ADHD symptoms.  She is here to discuss treatment.    Review of Systems:   Constitutional:  Denies fever or chills   Eyes:  Denies change in visual acuity   HENT:  Denies nasal congestion or sore throat   Respiratory:  Denies cough or shortness of breath   Cardiovascular:  Denies chest pain or edema   GI:  Denies abdominal pain, nausea, vomiting, bloody stools or diarrhea   GU:  Denies dysuria   Musculoskeletal:  Denies back pain or joint pain   Integument:  Denies rash   Neurologic:  Denies headache, focal weakness or sensory changes   Endocrine:  Denies polyuria or polydipsia   Lymphatic:  Denies swollen glands   Psychiatric:  Denies depression or anxiety        Current Outpatient Medications   Medication Sig Dispense Refill    omeprazole (PRILOSEC) 20 MG delayed release capsule Take 1 capsule by mouth Daily 90 capsule 1    amphetamine-dextroamphetamine (ADDERALL, 10MG ,) 10 MG tablet Take 1 tablet by mouth daily for 30 days. Max Daily Amount: 10 mg 30 tablet 0    ferrous sulfate (IRON 325) 325 (65 Fe) MG tablet TAKE 1 TABLET BY MOUTH TWICE A DAY 180 tablet  1    amLODIPine (NORVASC) 10 MG tablet Take 1 tablet by mouth daily 90 tablet 1     No current facility-administered medications for this visit.       Objective       Lab Results   Component Value Date    WBC 7.9 03/24/2023    HGB 10.7 (L) 03/24/2023    HCT 34.6 (L) 03/24/2023    MCV 75.3 (L) 03/24/2023    PLT 426 03/24/2023      Lab Results   Component Value Date    NA 140 03/24/2023    K 4.5 03/24/2023    CL 105 03/24/2023    CO2 27 03/24/2023    BUN 11 03/24/2023    CREATININE 0.8 03/24/2023    GLUCOSE 78 03/24/2023    CALCIUM 9.5 03/24/2023    BILITOT 0.3 03/24/2023    ALKPHOS 55 03/24/2023    AST 22 03/24/2023    ALT 16 03/24/2023    LABGLOM >90 03/24/2023    GFRAA >60 02/22/2020     AGRATIO 1.0 (L) 03/24/2023    GLOB 4.1 02/22/2020     Lab Results   Component Value Date    CHOL 146 02/22/2020     Lab Results   Component Value Date    TRIG 40 02/22/2020     Lab Results   Component Value Date    HDL 50 03/24/2023    HDL 61 (H) 10/19/2021    HDL 51 02/22/2020     No components found for: "LDLCHOLESTEROL", "LDLCALC"  Lab Results   Component Value Date    VLDL 10 03/24/2023    VLDL 16 10/19/2021    VLDL 8 02/22/2020     No results found for: "CHOLHDLRATIO"  Lab Results   Component Value Date    LABA1C 5.5 03/24/2023     Lab Results   Component Value Date    EAG 111.2 03/24/2023         Physical Exam:  Vital Signs: BP 136/84   Pulse 67   Temp 97.7 F (36.5 C)   Ht 1.753 m (5\' 9" )   Wt 135.6 kg (299 lb)   SpO2 100%   BMI 44.15 kg/m   Constitutional:  Well developed, well nourished, no acute distress, non-toxic appearance   Eyes:  PERRL, conjunctiva normal   HENT:  Atraumatic, external ears normal, nose normal, oropharynx moist, no pharyngeal exudates. Neck- normal range of motion, no tenderness, supple   Respiratory:  No respiratory distress, normal breath sounds, no rales, no wheezing   Cardiovascular:  Normal rate, normal rhythm, no murmurs, no gallops, no rubs   GI:  Soft, nondistended, normal bowel sounds, nontender, no organomegaly, no mass, no rebound, no guarding   GU:  No costovertebral angle tenderness   Musculoskeletal:  No edema, no tenderness, no deformities. Back- no tenderness  Integument:  Well hydrated, no rash   Lymphatic:  No lymphadenopathy noted   Neurologic:  Alert & oriented x 3, CN 2-12 normal, normal motor function, normal sensory function, no focal deficits noted   Psychiatric:  Speech and behavior appropriate          Please note that this chart was generated using dragon dictation software.  Although every effort was made to ensure the accuracy of this automated transcription, some errors in transcription may have occurred.       --Margarito Liner, MD

## 2023-05-30 NOTE — Assessment & Plan Note (Addendum)
Patient was asked about her current diet and exercise habits, and personalized advice was provided regarding recommended lifestyle changes.  Patient's comorbid health conditions associated with elevated BMI were discussed, including dyslipidemia and hypertension, as well as the likely benefits of weight loss.  Based upon patient's motivation to change her behavior, the following plan was agreed upon to work toward a weight loss goal of 90 pounds: low carbohydrate diet, exercise for at least 30 minutes 4-5 days per week, and medication prescribed: [place] but insurance not covered . Educational materials for  weight loss were provided.  Patient will follow-up in 3 month(s) with PCP.  Provider spent 20 minutes counseling patient.    Insurance does not cover weight loss med.   Referred to weight management in the past but did not want to start it.  She has been working on increased exercise healthy diet

## 2023-06-21 NOTE — Telephone Encounter (Signed)
Left message for patient regarding MWM seminar.  Reminded to arrive on time with completed paperwork, id, ins card.  Left office number if questions.

## 2023-06-22 ENCOUNTER — Encounter: Payer: PRIVATE HEALTH INSURANCE | Primary: Student in an Organized Health Care Education/Training Program

## 2023-06-30 ENCOUNTER — Encounter
Payer: PRIVATE HEALTH INSURANCE | Attending: Family Medicine | Primary: Student in an Organized Health Care Education/Training Program

## 2023-08-30 ENCOUNTER — Encounter
Payer: PRIVATE HEALTH INSURANCE | Attending: Student in an Organized Health Care Education/Training Program | Primary: Student in an Organized Health Care Education/Training Program
